# Patient Record
Sex: Female | Born: 1985 | Race: Black or African American | Hispanic: No | Marital: Single | Smoking: Never smoker
Health system: Southern US, Community
[De-identification: ages and names within clinical notes are randomized; demographics above are authoritative.]

## PROBLEM LIST (undated history)

## (undated) DIAGNOSIS — I10 Essential (primary) hypertension: Secondary | ICD-10-CM

## (undated) DIAGNOSIS — E669 Obesity, unspecified: Secondary | ICD-10-CM

## (undated) DIAGNOSIS — L089 Local infection of the skin and subcutaneous tissue, unspecified: Secondary | ICD-10-CM

## (undated) HISTORY — PX: KNEE SURGERY: SHX244

---
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
## 2006-03-16 ENCOUNTER — Emergency Department (HOSPITAL_COMMUNITY): Admission: EM | Admit: 2006-03-16 | Discharge: 2006-03-16 | Payer: Self-pay | Admitting: *Deleted

## 2006-04-04 ENCOUNTER — Ambulatory Visit: Payer: Self-pay | Admitting: *Deleted

## 2006-04-04 ENCOUNTER — Encounter (INDEPENDENT_AMBULATORY_CARE_PROVIDER_SITE_OTHER): Payer: Self-pay | Admitting: Specialist

## 2006-05-27 ENCOUNTER — Emergency Department (HOSPITAL_COMMUNITY): Admission: EM | Admit: 2006-05-27 | Discharge: 2006-05-27 | Payer: Self-pay | Admitting: Emergency Medicine

## 2006-06-03 ENCOUNTER — Emergency Department (HOSPITAL_COMMUNITY): Admission: EM | Admit: 2006-06-03 | Discharge: 2006-06-03 | Payer: Self-pay | Admitting: Emergency Medicine

## 2006-09-21 ENCOUNTER — Emergency Department (HOSPITAL_COMMUNITY): Admission: EM | Admit: 2006-09-21 | Discharge: 2006-09-21 | Payer: Self-pay | Admitting: Emergency Medicine

## 2006-09-26 ENCOUNTER — Emergency Department (HOSPITAL_COMMUNITY): Admission: EM | Admit: 2006-09-26 | Discharge: 2006-09-26 | Payer: Self-pay | Admitting: Emergency Medicine

## 2007-03-08 ENCOUNTER — Emergency Department (HOSPITAL_COMMUNITY): Admission: EM | Admit: 2007-03-08 | Discharge: 2007-03-08 | Payer: Self-pay | Admitting: Emergency Medicine

## 2007-05-15 ENCOUNTER — Inpatient Hospital Stay (HOSPITAL_COMMUNITY): Admission: AD | Admit: 2007-05-15 | Discharge: 2007-05-15 | Payer: Self-pay | Admitting: Family Medicine

## 2007-12-01 ENCOUNTER — Inpatient Hospital Stay (HOSPITAL_COMMUNITY): Admission: AD | Admit: 2007-12-01 | Discharge: 2007-12-02 | Payer: Self-pay | Admitting: Obstetrics & Gynecology

## 2007-12-04 ENCOUNTER — Inpatient Hospital Stay (HOSPITAL_COMMUNITY): Admission: AD | Admit: 2007-12-04 | Discharge: 2007-12-04 | Payer: Self-pay | Admitting: Obstetrics & Gynecology

## 2007-12-11 ENCOUNTER — Inpatient Hospital Stay (HOSPITAL_COMMUNITY): Admission: RE | Admit: 2007-12-11 | Discharge: 2007-12-11 | Payer: Self-pay | Admitting: Obstetrics & Gynecology

## 2008-01-11 ENCOUNTER — Inpatient Hospital Stay (HOSPITAL_COMMUNITY): Admission: AD | Admit: 2008-01-11 | Discharge: 2008-01-11 | Payer: Self-pay | Admitting: Obstetrics and Gynecology

## 2008-04-15 ENCOUNTER — Inpatient Hospital Stay (HOSPITAL_COMMUNITY): Admission: AD | Admit: 2008-04-15 | Discharge: 2008-04-15 | Payer: Self-pay | Admitting: Obstetrics and Gynecology

## 2008-05-15 ENCOUNTER — Inpatient Hospital Stay (HOSPITAL_COMMUNITY): Admission: AD | Admit: 2008-05-15 | Discharge: 2008-05-21 | Payer: Self-pay | Admitting: Obstetrics and Gynecology

## 2008-05-19 ENCOUNTER — Encounter (INDEPENDENT_AMBULATORY_CARE_PROVIDER_SITE_OTHER): Payer: Self-pay | Admitting: Obstetrics & Gynecology

## 2008-05-19 DIAGNOSIS — O42919 Preterm premature rupture of membranes, unspecified as to length of time between rupture and onset of labor, unspecified trimester: Secondary | ICD-10-CM
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
## 2009-01-13 ENCOUNTER — Emergency Department (HOSPITAL_COMMUNITY): Admission: EM | Admit: 2009-01-13 | Discharge: 2009-01-14 | Payer: Self-pay | Admitting: Emergency Medicine

## 2009-01-16 ENCOUNTER — Emergency Department (HOSPITAL_COMMUNITY): Admission: EM | Admit: 2009-01-16 | Discharge: 2009-01-16 | Payer: Self-pay | Admitting: Family Medicine

## 2009-04-18 ENCOUNTER — Emergency Department (HOSPITAL_COMMUNITY): Admission: EM | Admit: 2009-04-18 | Discharge: 2009-04-18 | Payer: Self-pay | Admitting: Emergency Medicine

## 2009-07-16 ENCOUNTER — Emergency Department (HOSPITAL_COMMUNITY): Admission: EM | Admit: 2009-07-16 | Discharge: 2009-07-17 | Payer: Self-pay | Admitting: Emergency Medicine

## 2009-10-18 IMAGING — US US OB TRANSVAGINAL
1 series · 14 of 28 positions shown · non-contrast
Comparison: none

OBSTETRICAL ULTRASOUND:

 This ultrasound exam was performed in the [HOSPITAL] Ultrasound Department.  The OB US report was generated in the AS system, and faxed to the ordering physician.  This report is also available in [REDACTED] PACS.

[Series 1: us ob transvaginal · 0.13mm/px · 14 of 32 slices shown]
[im 2/32]
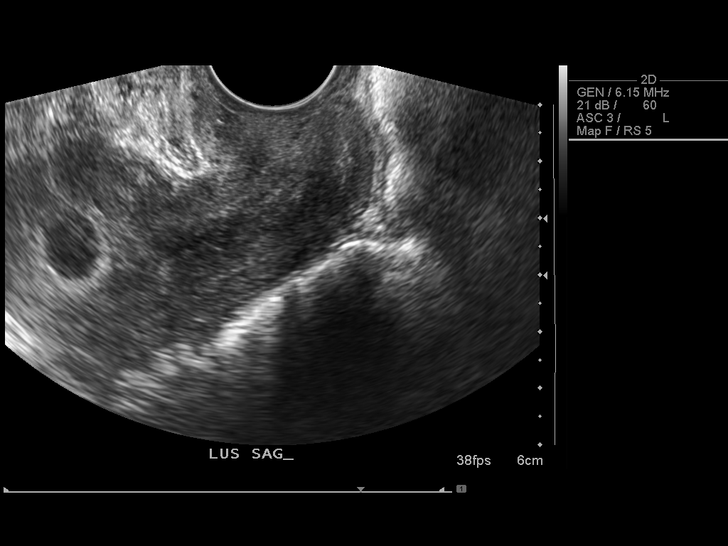
[im 4/32]
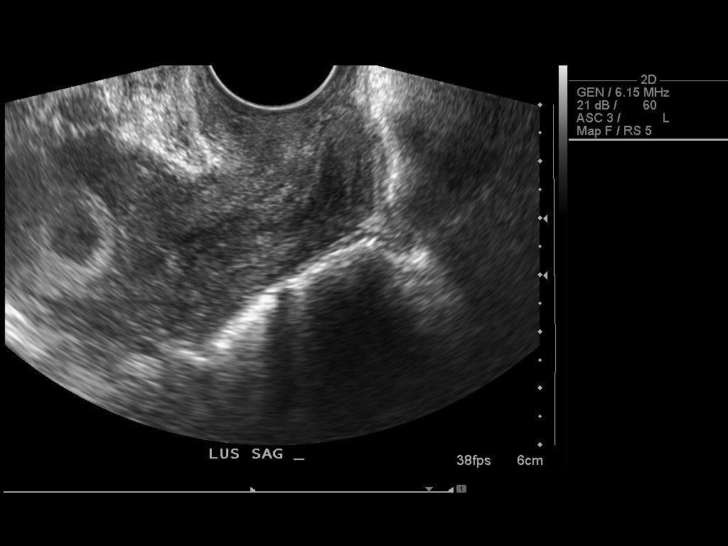
[im 6/32]
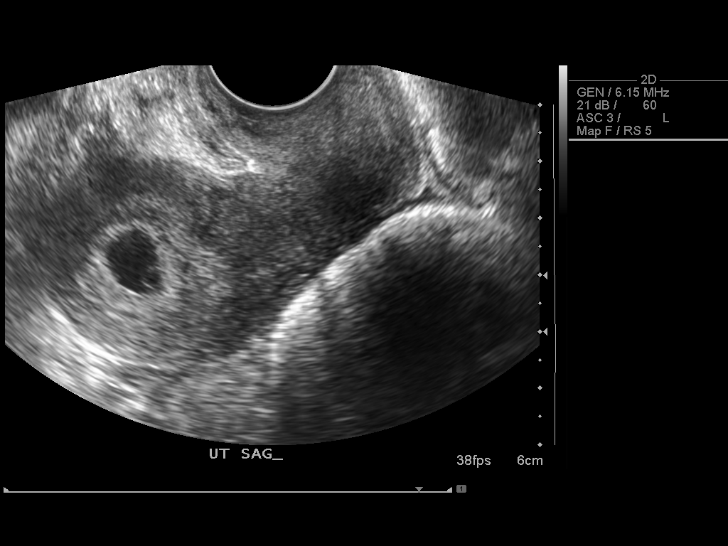
[im 9/32]
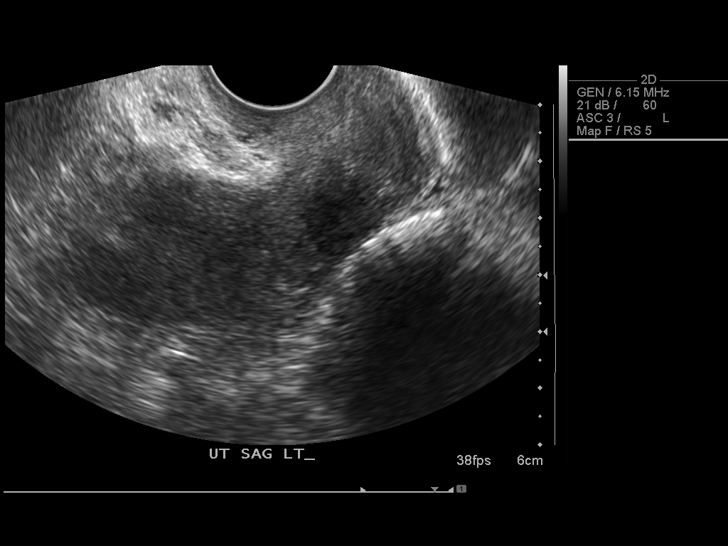
[im 11/32]
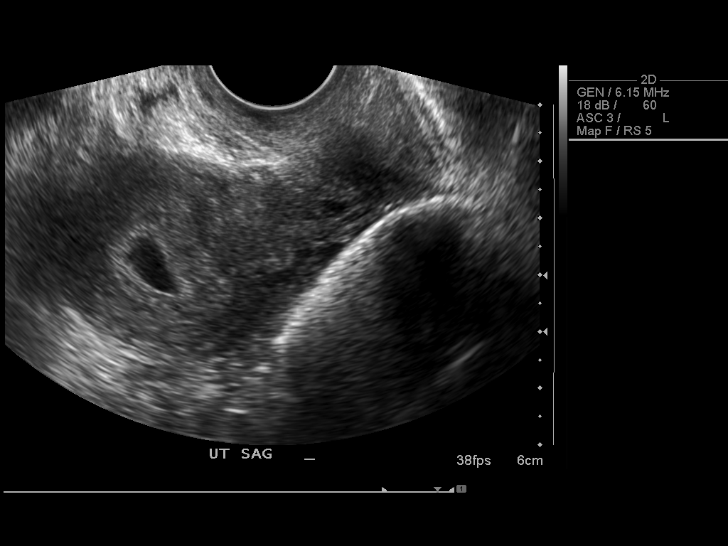
[im 13/32]
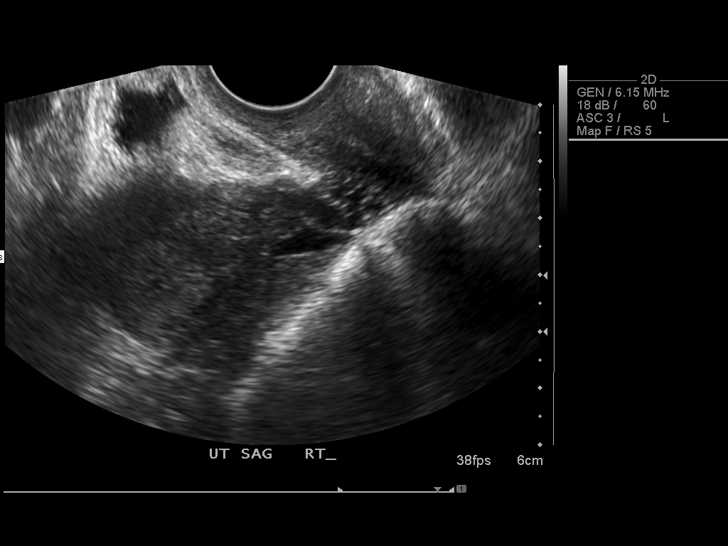
[im 15/32]
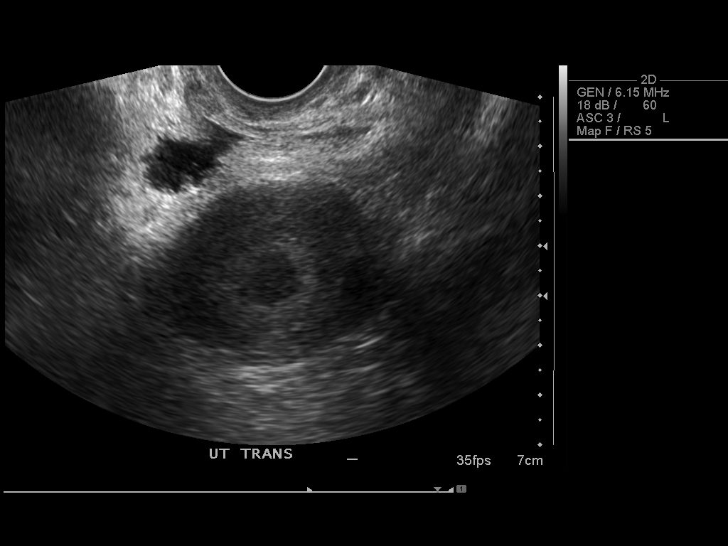
[im 18/32]
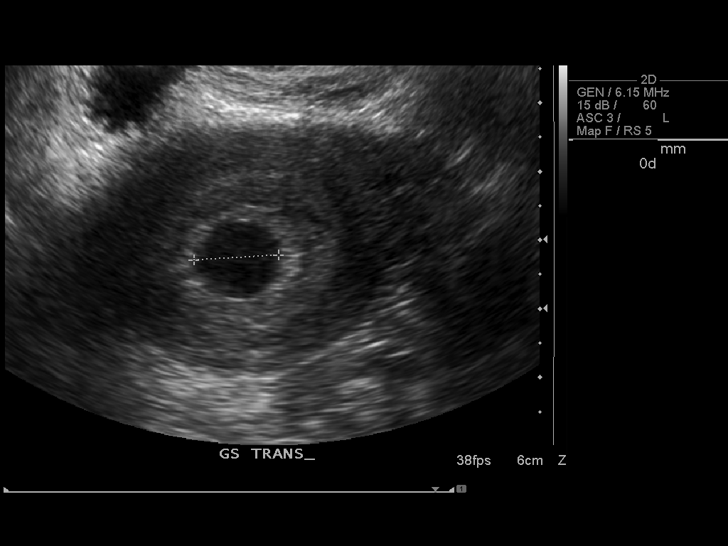
[im 20/32]
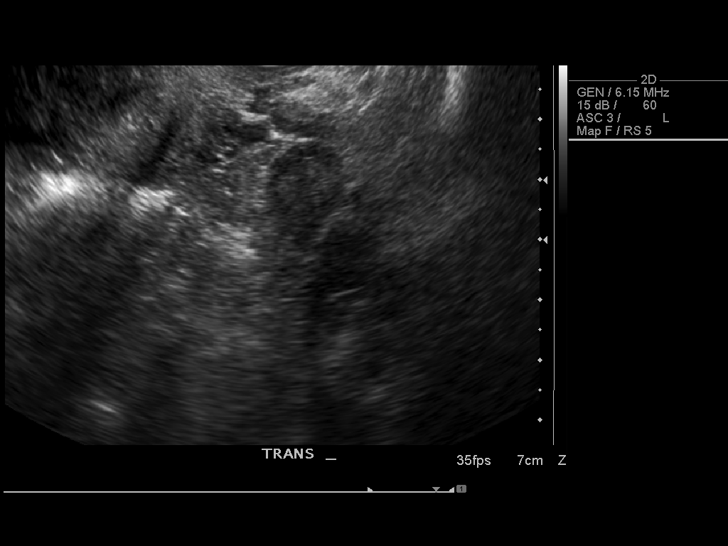
[im 22/32]
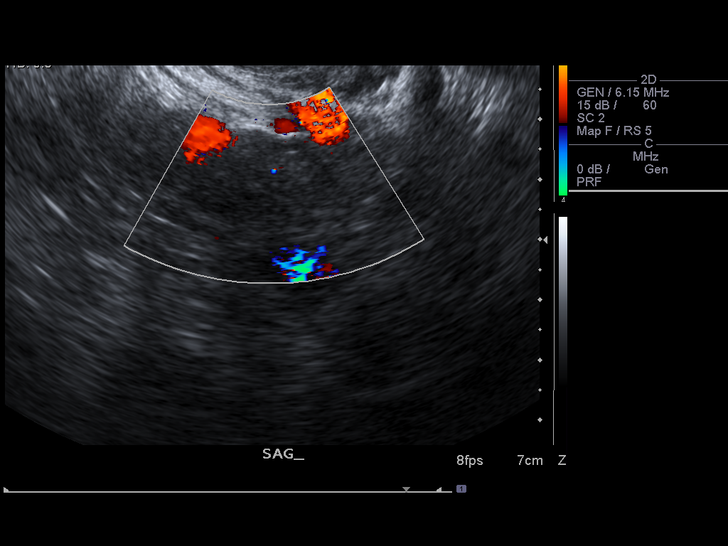
[im 25/32]
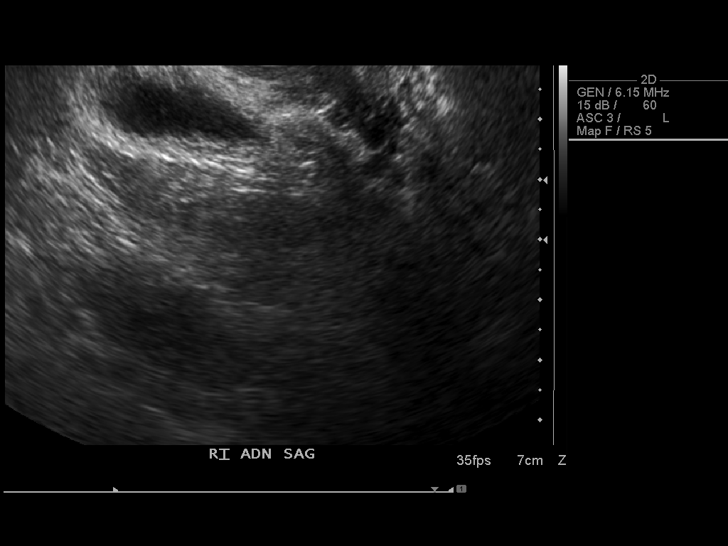
[im 27/32]
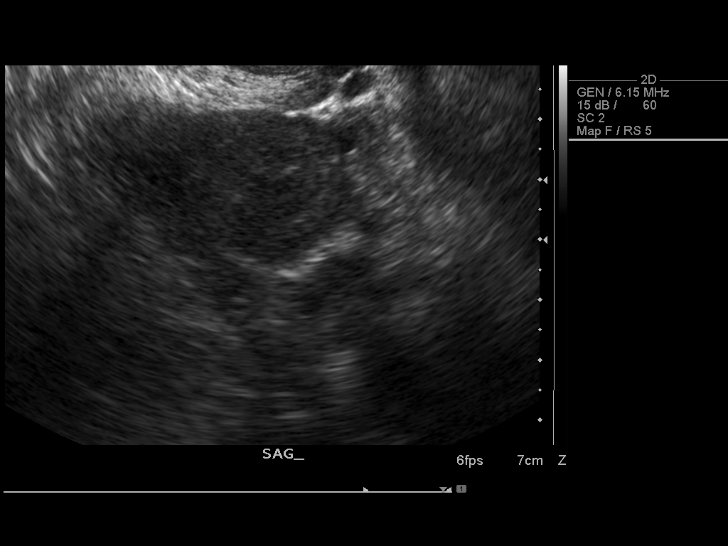
[im 29/32]
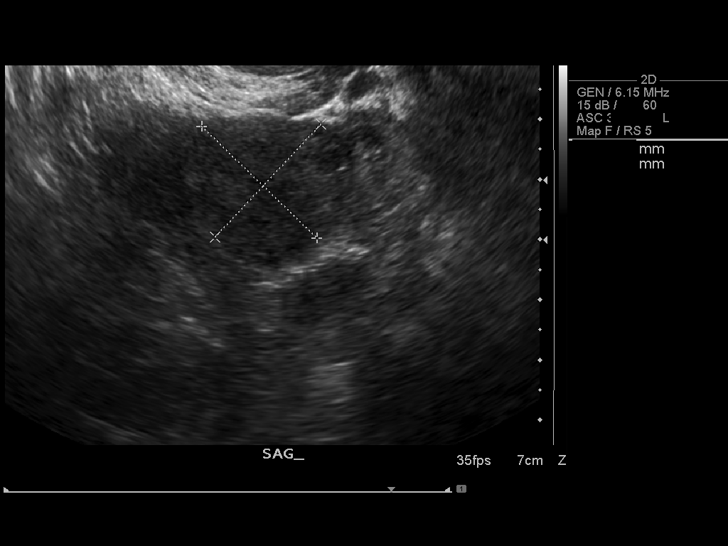
[im 32/32]
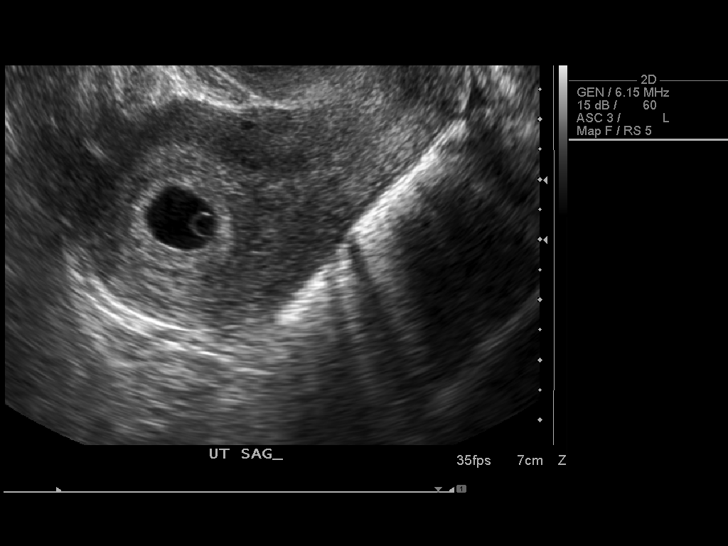

[14 of 28 positions shown; findings below may reference images not displayed]

IMPRESSION: See AS Obstetric US report.

## 2009-11-09 ENCOUNTER — Emergency Department (HOSPITAL_COMMUNITY): Admission: EM | Admit: 2009-11-09 | Discharge: 2009-11-09 | Payer: Self-pay | Admitting: Emergency Medicine

## 2010-11-05 ENCOUNTER — Encounter: Payer: Self-pay | Admitting: Obstetrics & Gynecology

## 2010-11-05 ENCOUNTER — Encounter: Payer: Self-pay | Admitting: Family Medicine

## 2011-01-24 LAB — URINALYSIS, ROUTINE W REFLEX MICROSCOPIC
Bilirubin Urine: NEGATIVE
Glucose, UA: NEGATIVE mg/dL
Hgb urine dipstick: NEGATIVE
Specific Gravity, Urine: 1.036 — ABNORMAL HIGH (ref 1.005–1.030)
pH: 6.5 (ref 5.0–8.0)

## 2011-01-24 LAB — URINE MICROSCOPIC-ADD ON

## 2011-02-24 IMAGING — CR DG WRIST COMPLETE 3+V*L*
1 series · 1 of 1 positions shown · non-contrast
Comparison: None

CLINICAL DATA: Pain in the wrist for a month, no acute injury

LEFT WRIST - COMPLETE 3+ VIEW

[view not recorded]
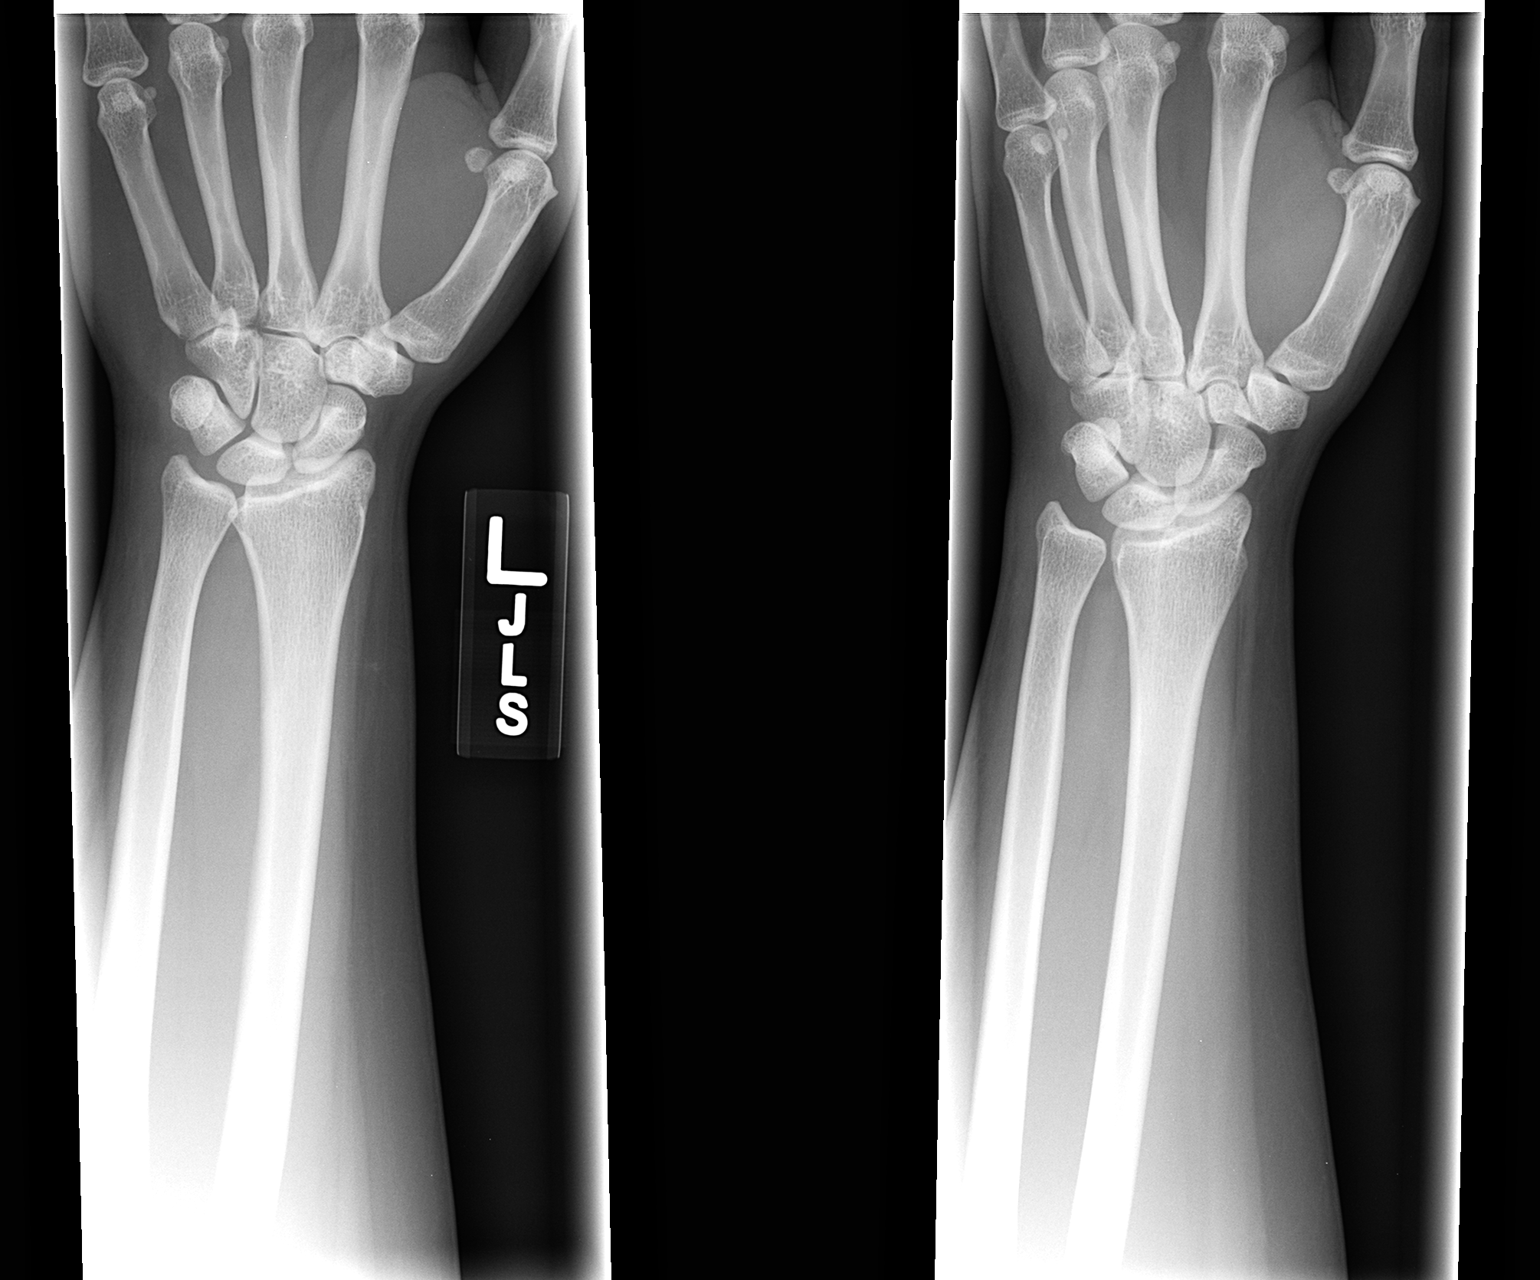

[1 of 1 positions shown; findings below may reference images not displayed]

FINDINGS: The radiocarpal joint space appears normal and the carpal
bones are in normal position.  Intercarpal spaces are normal.  No
acute abnormality is seen and alignment is normal
IMPRESSION: Negative left wrist.
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
## 2011-03-02 NOTE — H&P (Signed)
NAME:  Carly Murray, Carly Murray                       ACCOUNT NO.:  [id number]   MEDICAL RECORD NO.:  [id number]                   PATIENT TYPE:  INP   LOCATION:  0105                                 [facility]ACILITY:  [facility]   PHYSICIAN:  Christene Slates. Katrinka Blazing, M.D.              DATE OF BIRTH:  1985/12/04   DATE OF ADMISSION:  02/16/2004  DATE OF DISCHARGE:                         PSYCHIATRIC ADMISSION ASSESSMENT   IDENTIFICATION:  This 25-year-old female, an 11th grade student at  [employer], was admitted emergently voluntarily in transfer from  the [facility] Emergency Room by ACT assessment of Rick __________,  M.D., for inpatient stabilization of suicide risk and depression.  The  patient had overdosed with Aleve last week and has continued suicide  ideation.   HISTORY OF PRESENT ILLNESS:  The patient is helpless and hopeless in her  current status.  She is worrying excessively and retentively about her  responsibilities not being kept, particularly toward getting college some  day and taking care of grandparents with whom she currently lives.  She  lives with maternal grandmother and grandfather, who are ill.  The patient  indicates that she has had a rough life, but that she has also inherited a  diathesis to depression.  She states that mother and sister are depressed  and both take antidepressants.  The patient reports a 20-pound weight loss  in the last two months.  She reports initial and terminal insomnia.  She has  increased guilt and feelings of worthlessness, hopelessness, and  helplessness.  She indicates that she is generally motivated and energetic.  She has diminished appetite overall.  The patient is finding difficulty  keeping up and feels progressively depressed about this.  She has self-  medicated with cannabis twice weekly for the last month, using three or four  cannabis cigarettes at a time.  She also smokes two cigarettes daily.  She  denies any previous  mental health treatment.  Her worry is difficult to  define.  The patient does not open up and talk about her problems, but  rather has a difficult time enumerating them.  She tends to keep her  problems inside.  She does not acknowledge other substance abuse at this  time.  She denies hallucinations or delusions.  She does have migraine  treated with Midrin.  She also uses Depo-Provera, but denies any definite  correlation of mood difficulty with Depo-Provera chronologically.  She has  no definite history of premenstrual or seasonal exacerbation of mood.  She  appears to have more longstanding tendency to anxiety and worry, but now  weeks to several months progressive depression.   PAST MEDICAL HISTORY:  The patient has a history of migraine treated with  p.r.n. Midrin.  She had her last menses on February 11, 2004.  She is receiving  Depo-Provera.  She had an Aleve overdose last week with no definite  complications medically.  She is  overweight.  She was medically cleared in  the emergency room.  She appears to be likely sexually active by her self-  report.  She does wear glasses.  She has no history of seizure or syncope.  She has no heart murmur or arrhythmia.   ALLERGIES:  She has no medication allergies.   REVIEW OF SYSTEMS:  The patient denies difficulty with gait, gaze, or  continence.  She denies exposure to communicable disease or toxins.  She  denies rash, jaundice, or purpura.  There is no chest pain, palpitations, or  presyncope.  There is no abdominal pain, nausea, vomiting, or diarrhea.  There is no dysuria or arthralgia.   IMMUNIZATIONS:  Up to date.   FAMILY HISTORY:  The patient lives with her maternal grandparents, who are  ill.  Her parents divorced when she was 34 years of old.  Her sister,  Burnett Kanaris, age 19, and her mother both are on antidepressants for depression.  A grandfather and uncle have substance abuse with alcohol.   SOCIAL AND DEVELOPMENTAL HISTORY:   The patient has a part-time job.  She is  an 11th grade student at [employer].  She hopes to go to college  in [place].  However, she is worried she will make any of these goals she  has set for herself.  She has some generalized and obsessive worry and tends  to be retentive.  She is sexually active.  She acknowledges the use of  cannabis over the last month apparently in a self-medicating way.   ASSETS:  The patient is intelligent and motivated in the past.   MENTAL STATUS EXAMINATION:  Height is 62 inches and weight is 184 pounds  with blood pressure 117/60 and heart rate 73 supine and standing blood  pressure 131/69 with heart rate of 74.  The neurological exam is intact.  She has moderate to severe dysphoria consistent with major depression.  She  has some generalized and obsessive worry with retentive features that appear  to be more longstanding, but repressed and suppressed.  She has no mania or  psychosis.  She has no dissociation.  She does not present any homocide or  assaultive ideation, intents, or plan.  She does have suicidal ideation and  the attempt by Aleve overdose last week.   IMPRESSION:   AXIS I:  1. Major depression, single episode, severe.  2. 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  3. Rule out cannabis abuse (provisional diagnosis).  4. Parent/child problem.  5. Other specified family circumstances.   AXIS II:  Deferred.   AXIS III:  1. Migraine.  2. Overweight.  3. Depo-Provera.  4. Aleve overdose recently.   AXIS IV:  Stressors:  Family, severe, acute and chronic; phase of life,  severe, acute.   AXIS V:  Global assessment of functioning 38 with highest in the last year  of 78.   PLAN:  The patient is admitted for inpatient adolescent psychiatric and  multidisciplinary and multimodal behavioral treatment in a team based program on the adolescent psychiatric unit.  Luvox pharmacotherapy  is  recommended starting at 100 mg nightly.  Cognitive, behavioral, and group  psychotherapies and substance abuse interventions are planned.  Family  therapy is also indicated.  The estimated length of stay is five to six days  with targets for discharge being stabilization of suicide risk and mood,  stabilization of anxiety, establishment of capacity for containment and  communicative problem solving, and generalization of capacity for safe  effective participation in outpatient treatment.                                               Christene Slates Katrinka Blazing, M.D.    DGS/MEDQ  D:  02/17/2004  T:  02/18/2004  Job:  [id number]

## 2011-03-02 NOTE — Op Note (Signed)
NAME:  Carly Murray, Carly Murray             ACCOUNT NO.:  [id number]   MEDICAL RECORD NO.:  [id number]          PATIENT TYPE:  OIB   LOCATION:  2550                         FACILITY:  [facility]   PHYSICIAN:  Zola Button T. Lazarus Salines, M.D. 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   PROCEDURES PERFORMED:  Tonsillectomy and adenoidectomy.   SURGEON:  Gloris Manchester. Lazarus Salines, M.D.   ANESTHESIA:  General orotracheal.   BLOOD LOSS:  Minimal.   COMPLICATIONS:  None.   FINDINGS:  Embedded tonsils, 3+. Normal soft palate. Moderate residual  adenoid pad. Relatively clear interior nose.   DESCRIPTION OF PROCEDURE:  With the patient in a comfortable supine  position, general orotracheal anesthesia was induced without difficulty. At  an appropriate level, the table was turned 90 degrees and the patient placed  in Trendelenburg. A clean preparation and draping was accomplished. Taking  care to protect lips, teeth, and endotracheal tube, the Crowe-Davis mouth  gag was introduced, expanded for visualization and suspended from the Mayo  stand in the standard fashion. The findings were as described above. Palate  retractor and mirror were used to visualize the nasopharynx with the  findings as described above. The anterior nose was inspected with the nasal  speculum with the findings as described above. Xylocaine 0.5% with 1:200,000  epinephrine, 8 mL total, was infiltrated into the peritonsillar planes in  both sides for intraoperative hemostasis. Several minutes were allowed this  to take effect.   A red rubber catheter was passed through the nose and out the mouth to serve  as a Producer, television/film/video. Using suction cautery and indirect  visualization, the  adenoids were ablated in the midline. Hemostasis was observed.   After completing the adenoids, the left tonsil was grasped and retracted  medially. The mucosa overlying the anterior and superior poles was  coagulated and then cut down to the capsule of the tonsil. Using the cautery  tip as a blunt dissector, lysing fibrous bands and coagulating crossing  vessels as identified, the tonsil was dissected free of its muscular fossa  from inferiorly upward. The tonsil was removed in its entirety as determined  by examination of both tonsil and fossa. A small additional quantity of  cautery rendered the fossa hemostatic. After completing the left  tonsillectomy, the right side was done in identical fashion.   After completing both tonsillectomies and rendering the oropharynx  hemostatic, the nasopharynx was inspected once again. Hemostasis was  observed.   An orogastric tube was briefly passed into the stomach and a small amount of  clear secretions was evacuated. The tube was removed. At this point, the  palate retractor and mouth gag were relaxed for several minutes. Upon re-  expansion, hemostasis was persistent. At this point, the procedure was  completed. The palate retractor and mouth gag were relaxed and removed. The  dental status was intact. The patient  was returned to anesthesia, awakened,  extubated and transferred to recovery in stable condition.   COMMENT:  A 25 year old black female with recurrent tonsillitis and  obstructive hypertrophy, as well as obesity and a possible history of early  sleep apnea was indication for today's procedure. Will observe for 23 hours  for extended recovery care. Anticipate routine postoperative recovery with  attention to analgesia, antibiosis, hydration and observation for bleeding,  emesis or airway compromise.       KTW/MEDQ  D:  04/26/2005  T:  04/26/2005  Job:  [id number]   cc:   Duncan Dull, M.D.  [address]  [address]  [place]  [place] [address]  Fax: [phone]

## 2011-03-02 NOTE — Discharge Summary (Signed)
Carly Murray, Carly Murray NO.:  [id number]   MEDICAL RECORD NO.:  [id number]          PATIENT TYPE:  INP   LOCATION:  9318                          [facility]ILITY[facility]  [facility]   PHYSICIAN:  Randye Lobo, M.D.   DATE OF BIRTH:  02-17-1986   DATE OF ADMISSION:  05/15/2008  DATE OF DISCHARGE:  05/21/2008                               DISCHARGE SUMMARY   FINAL DIAGNOSES:  1. Intrauterine pregnancy at 28 weeks preterm labor.  2. Spontaneous vaginal delivery of a female infant with Apgars of 8 and      8.   DELIVERY PERFORMED:  Ilda Mori, MD   COMPLICATIONS:  None.   HISTORY:  This 25 year old G1 P0 presents at 27 weeks' gestation with  preterm labor and advanced cervical dilation.  The patient's antepartum  course up to this point had been uncomplicated.  She was being followed  for dysplasia on a Pap smear but her 20-week ultrasound had a cervix of  over 4 cm.  The patient otherwise has had an uncomplicated antepartum  course to this point.  She was placed on magnesium sulfate, admitted for  steroid protocol and penicillin, pending group B strep culture.  Maternal Fetal Medicine was consulted and the patient was started on  Indocin.  The patient was continued to be monitored throughout her first  couple days in the hospital, her contractions decreased.  The patient  had ultrasound performed and was felt to be stable.  By hospital day #5,  the patient started contracting some more and Maternal Fetal Medicine  was consulted.  The patient ended up dilating to complete and on May 19, 2008 had a spontaneous vaginal delivery of a female infant with Apgars  of 8 and 8.  The baby was taken immediately to the NICU and delivery  performed by Dr. Ilda Mori went without complications.  The  patient's postpartum course was benign without significant fevers.  The  baby was stable in the NICU.  The patient did want to receive a Depo-  Provera before discharge which was performed.   She was sent home on a  regular diet, told to decrease activities, told to continue her  vitamins, and was to follow up in our office in 4 weeks.  Instructions  and precautions were reviewed with the patient.   LABS ON DISCHARGE:  The patient had a hemoglobin of 11.4, white blood  cell count of 14.7, and platelets of 291,000.      Leilani Able, P.A.-C.      Randye Lobo, M.D.  Electronically Signed    MB/MEDQ  D:  06/21/2008  T:  06/21/2008  Job:  [id number]

## 2011-03-02 NOTE — Group Therapy Note (Signed)
NAME:  SYDNI, ELIZARRARAZ NO.:  [id number]   MEDICAL RECORD NO.:  [id number]          PATIENT TYPE:  WOC   LOCATION:  [facility]                   FACILITY:  [facility]   PHYSICIAN:  Carolanne Grumbling, M.D.   DATE OF BIRTH:  [birth year]   DATE OF SERVICE:  04/04/2006                                    CLINIC NOTE   GYNECOLOGIC OFFICE VISIT.   CLINIC VISIT:  The patient is an 25 year old G0 who presents for followup of  abnormal Pap smear.  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  She has not had any problems since then.  Pap smear was done.   PLAN:  The patient will be notified via mail when she needs to have her next  Pap smear.           ______________________________  Carolanne Grumbling, M.D.     TW/MEDQ  D:  04/04/2006  T:  04/04/2006  Job:  [id number]

## 2011-03-02 NOTE — Discharge Summary (Signed)
NAME:  Carly Murray, Carly Murray                       ACCOUNT NO.:  [id number]   MEDICAL RECORD NO.:  [id number]                   PATIENT TYPE:  INP   LOCATION:  0107                                 [facility]ILITY[facility]   PHYSICIAN:  Beverly Milch, MD                  DATE OF BIRTH:  Jul 30, 1986   DATE OF ADMISSION:  02/16/2004  DATE OF DISCHARGE:  02/21/2004                                 DISCHARGE SUMMARY   IDENTIFICATION:  This 25 1/25-year-old female, 11th grade student at [employer], was admitted emergently voluntarily in transfer from [facility] Emergency Room for inpatient stabilization of suicide risk and  depression.  The patient had continued suicide ideation and an overdose of  Aleve the week before.  She was helpless and hopeless, overwhelmed with  current daily life as well as having a significant diaphysis to depression  irritably.  For full details, please see the typed admission assessment.   SYNOPSIS OF THE PRESENT ILLNESS:  The patient had initial and terminal  insomnia, 20 pound weight loss in the last two months, guilt, worthlessness,  diminished appetite and was self-medicating with cigarettes and occasional  cannabis.  She described __________ of worry at night preventing sleep.  She  discounts any side effects from Depo Provera.  She has a history of  migraine, treated with p.r.n. Midrin.  Mother and sister are on  antidepressant medications for depression and a grandfather and uncle have  substance abuse with alcohol.  The parents divorced when the patient was age  [age].   INITIAL MENTAL STATUS EXAM:  There was moderate to severe dysphoria.  There  was some generalized and obsessive anxiety.  She tends to be retentive in  storing up of negative emotion.  There is no mania or psychosis.  There is  suicide ideation and previous overdose suicide attempt recently.   LABORATORY FINDINGS:  CBC was normal, except platelet count elevated at  343,000 with  reference range 170,000 to 325,000.  White count was normal at  8,700, hemoglobin 14.4, MCV of 87.  Random glucose in the emergency room at  1316 hours was 101 with fasting glucose repeated at 82.  Sodium was normal  at 139, potassium 4.3, creatinine 0.8, and calcium 9.3 with AST 15, ALT 13  and albumin 3.5.  Glycosylated hemoglobin was normal at 5.5 with reference  range 4.6 to 6.1.  GGT was normal at 10.  TSH was normal at 1.04.  Urine HCG  was negative.  Urine drug screen was negative.   HOSPITAL COURSE AND TREATMENT:  General medical exam by Vic Ripper,  P.A.C. noted left knee arthroscopy by history and one or two cigarettes  daily.  The patient feels that she is struggling with math.  She had  menarche at age 2 and reports sexual activity.  She wears glasses and has a  birthmark on the right flank.  GY[facility] care is at [facility]  Department.  Admission height was 62 inches and weight 184 pounds with blood  pressure 117/60 and a heart rate of 73 supine and standing blood pressure  131/69 with heart rate of 74.  Vital signs were normal throughout hospital  stay and discharge blood pressure was 114/68 with heart rate of 79 supine  and 122/83 with heart rate of 105 standing.  The patient did require Ambien  5 mg at bedtime to sleep well.  She tolerated Luvox well with 100 mg  nightly, and at the time of discharge, dose was increased to 200 mg nightly,  targeting that necessary for efficacy and noting no side effects.  There  were no suicide related side effects.  Discharge weight was 189.5 pounds.  Midrin was required on one occasion during her hospital stay, Feb 20, 2004.  The patient participated in all aspects of active inpatient treatment  including group, milieu, behavioral, individual, anger management, substance  abuse intervention, family, special educational therapy, occupational and  therapeutic recreational therapies.  The patient's anxiety significantly   improved and her dysphoria was showing daily improvement by the time of  discharge.  Suicide ideation remitted.  The patient is recompensated for  aggressive stepwise resumption of her responsibilities and addressed family  issues, particularly in the final family therapy session with mother.  The  patient confronted mother that mother and other siblings must do more to  care for the maternal grandparents that the patient has been attempting to  take care of.  The patient and mother made specific plans to work together  in this regards.  The patient became much more capable of her verbal problem  identification and solving.  She and mother plan to request some math  tutoring from the school for the patient.  She was discharged home in  improved condition, free of suicide ideation.   FINAL DIAGNOSES:   AXIS I:  1. Major depression, single episode, severe.  2. Anxiety disorder, not otherwise specified, with generalized and obsessive     features.  3. Parent-child problem.  4. Other specified family circumstances.   AXIS II:  Diagnosis deferred.   AXIS III:  1. Migraine.  2. Overweight.  3. Depo Provera.  4. Recent Aleve overdose.  5. Reactive thrombocytosis.   AXIS IV:  Stressors:  Family - Severe, acute and chronic; phase of life -  severe, acute.   AXIS V:  Global assessment of functioning on admission was 38 with highest  in the last year of 78 and discharge global assessment of functioning of 55.   PLAN:  The patient was discharged on the following medications:  1. Fluvoxamine 100 mg tablets, to take two tablets every bedtime, quantity     No. 60 with no refill prescribed.  2. Ambien 5 mg tablets, to take one at bedtime if needed for insomnia,     quantity No. 30 with no refill.  3. Midrin, having her own home supply and directions.  4. Depo Provera, as per health department treatment plan.  The patient follows a weight control diet and has no restrictions on  physical  activity.  Crisis and safety plans are established if needed.  She  will see Dr. Mariana Single for psychiatric follow-up on Feb 24, 2004 at 0930.  She will see Abel Presto for individual and family therapy, Feb 24, 2004 at  11:00 a.m.   There is a signed release on the chart for this courtesy  copy.                                               Beverly Milch, MD   GJ/MEDQ  D:  02/23/2004  T:  02/23/2004  Job:  [id number]   cc:   Attn:  Dr. Mariana Single and [facility]  [facility]  Outpatient Psychiatry  [address], [place], [place] [address]

## 2011-07-06 LAB — POCT PREGNANCY, URINE: Preg Test, Ur: POSITIVE

## 2011-07-06 LAB — WET PREP, GENITAL
Trich, Wet Prep: NONE SEEN
Yeast Wet Prep HPF POC: NONE SEEN

## 2011-07-06 LAB — URINALYSIS, ROUTINE W REFLEX MICROSCOPIC
Bilirubin Urine: NEGATIVE
Glucose, UA: NEGATIVE
Hgb urine dipstick: NEGATIVE
Ketones, ur: NEGATIVE
Protein, ur: NEGATIVE

## 2011-07-06 LAB — GC/CHLAMYDIA PROBE AMP, GENITAL: Chlamydia, DNA Probe: NEGATIVE

## 2011-07-09 LAB — URINALYSIS, ROUTINE W REFLEX MICROSCOPIC
Glucose, UA: NEGATIVE
Hgb urine dipstick: NEGATIVE
Ketones, ur: 40 — AB
pH: 5.5

## 2011-07-13 LAB — COMPREHENSIVE METABOLIC PANEL
ALT: 27
Alkaline Phosphatase: 142 — ABNORMAL HIGH
CO2: 22
GFR calc non Af Amer: >60
Glucose, Bld: 87
Potassium: 3.7
Sodium: 136

## 2011-07-13 LAB — URINALYSIS, ROUTINE W REFLEX MICROSCOPIC
Glucose, UA: NEGATIVE
Ketones, ur: NEGATIVE
Protein, ur: NEGATIVE

## 2011-07-13 LAB — CBC
HCT: 33.5 — ABNORMAL LOW
HCT: 39.1
Hemoglobin: 11.4 — ABNORMAL LOW
Hemoglobin: 13.4
RDW: 12.9
RDW: 13
WBC: 14.8 — ABNORMAL HIGH

## 2011-07-13 LAB — STREP B DNA PROBE

## 2011-07-13 LAB — URINE MICROSCOPIC-ADD ON

## 2011-07-30 LAB — URINE MICROSCOPIC-ADD ON

## 2011-07-30 LAB — URINALYSIS, ROUTINE W REFLEX MICROSCOPIC
Glucose, UA: NEGATIVE
Ketones, ur: 40 — AB
Specific Gravity, Urine: >1.03 — ABNORMAL HIGH
pH: 6

## 2011-07-30 LAB — GC/CHLAMYDIA PROBE AMP, GENITAL: GC Probe Amp, Genital: NEGATIVE

## 2015-01-12 DIAGNOSIS — N871 Moderate cervical dysplasia: Secondary | ICD-10-CM | POA: Insufficient documentation

## 2022-07-17 ENCOUNTER — Other Ambulatory Visit: Payer: Self-pay

## 2022-07-17 ENCOUNTER — Emergency Department (HOSPITAL_BASED_OUTPATIENT_CLINIC_OR_DEPARTMENT_OTHER)
Admission: EM | Admit: 2022-07-17 | Discharge: 2022-07-17 | Discharge status: Against Medical Advice | Payer: Commercial Managed Care - HMO | Attending: Emergency Medicine | Admitting: Emergency Medicine

## 2022-07-17 ENCOUNTER — Encounter (HOSPITAL_BASED_OUTPATIENT_CLINIC_OR_DEPARTMENT_OTHER): Payer: Self-pay

## 2022-07-17 DIAGNOSIS — N611 Abscess of the breast and nipple: Secondary | ICD-10-CM | POA: Insufficient documentation

## 2022-07-17 DIAGNOSIS — Z5321 Procedure and treatment not carried out due to patient leaving prior to being seen by health care provider: Secondary | ICD-10-CM | POA: Insufficient documentation

## 2022-07-17 NOTE — ED Triage Notes (Signed)
POV, pt sts that she noticed two abscesses on left breast. One is draining pus/blood, the other one hasn't started draining yet. NAD. A&O x4, amb.  Pt has abscess on right breast and just completed abx for that.

## 2022-08-26 ENCOUNTER — Emergency Department (HOSPITAL_BASED_OUTPATIENT_CLINIC_OR_DEPARTMENT_OTHER)
Admission: EM | Admit: 2022-08-26 | Discharge: 2022-08-27 | Disposition: A | Discharge status: Home / Self Care | Payer: Commercial Managed Care - HMO | Attending: Emergency Medicine | Admitting: Emergency Medicine

## 2022-08-26 ENCOUNTER — Encounter (HOSPITAL_BASED_OUTPATIENT_CLINIC_OR_DEPARTMENT_OTHER): Payer: Self-pay | Admitting: *Deleted

## 2022-08-26 ENCOUNTER — Other Ambulatory Visit: Payer: Self-pay

## 2022-08-26 DIAGNOSIS — L089 Local infection of the skin and subcutaneous tissue, unspecified: Secondary | ICD-10-CM | POA: Diagnosis not present

## 2022-08-26 DIAGNOSIS — L02211 Cutaneous abscess of abdominal wall: Secondary | ICD-10-CM | POA: Insufficient documentation

## 2022-08-26 DIAGNOSIS — L0291 Cutaneous abscess, unspecified: Secondary | ICD-10-CM

## 2022-08-26 HISTORY — DX: Obesity, unspecified: E66.9

## 2022-08-26 HISTORY — DX: Local infection of the skin and subcutaneous tissue, unspecified: L08.9

## 2022-08-26 MED ORDER — LIDOCAINE HCL (PF) 1 % IJ SOLN
10.0000 mL | Freq: Once | INTRAMUSCULAR | Status: AC
  Administered 2022-08-26: 10 mL via INTRADERMAL
  Filled 2022-08-26: qty 10

## 2022-08-26 MED ORDER — CEPHALEXIN 250 MG PO CAPS: 250.0000 mg | ORAL_CAPSULE | Freq: Four times a day (QID) | ORAL | 0 refills | Status: DC

## 2022-08-26 MED ORDER — SULFAMETHOXAZOLE-TRIMETHOPRIM 800-160 MG PO TABS: 1.0000 | ORAL_TABLET | Freq: Two times a day (BID) | ORAL | 0 refills | Status: AC

## 2022-08-26 NOTE — ED Notes (Signed)
D/c paperwork reviewed with pt, incl prescriptions.  No questions or concerns at time of d/c. Ambulatory to ED exit without assistance, NAD>

## 2022-08-26 NOTE — ED Provider Notes (Signed)
[facility] Provider Note   CSN: [id number] Arrival date & time: 08/26/22  1859     History {Add pertinent medical, surgical, social history, OB history to HPI:1} Chief Complaint  Patient presents with   Recurrent Skin Infections    Carly Murray is a 36 y.o. female with no significant past medical history presenting to the emergency department for evaluation of an abscess.  Patient states she noted an abscess on her right lower abdomen 2 days ago.  Since then the abscess has increased in size and has become increasingly painful.  She has tried Tylenol and ibuprofen with minimal improvement.  Reports another abscess close to the umbilicus which has popped recently.  No fever.  Denies chest pain, shortness of breath, nausea, vomiting, constipation, diarrhea.  HPI     Home Medications Prior to Admission medications   Not on File      Allergies    Patient has no known allergies.    Review of Systems   Review of Systems  Physical Exam Updated Vital Signs BP (!) 158/93 (BP Location: Right Arm)   Pulse 79   Temp 98.3 F (36.8 C) (Oral)   Resp 16   Ht 5\' 2"  (1.575 m)   Wt 134 kg   LMP 08/09/2022   SpO2 99%   BMI 54.03 kg/m  Physical Exam Vitals and nursing note reviewed.  Constitutional:      Appearance: Normal appearance.  HENT:     Head: Normocephalic and atraumatic.     Mouth/Throat:     Mouth: Mucous membranes are moist.  Eyes:     General: No scleral icterus. Cardiovascular:     Rate and Rhythm: Normal rate and regular rhythm.     Pulses: Normal pulses.     Heart sounds: Normal heart sounds.  Pulmonary:     Effort: Pulmonary effort is normal.     Breath sounds: Normal breath sounds.  Abdominal:     General: Abdomen is flat.     Palpations: Abdomen is soft.     Tenderness: There is no abdominal tenderness.  Musculoskeletal:        General: No deformity.  Skin:    General: Skin is warm.     Findings: No rash.      Comments: 2 x 2 centimeter abscess on right lower quadrant.  No fluid or blood drainage.  Neurological:     General: No focal deficit present.     Mental Status: She is alert.  Psychiatric:        Mood and Affect: Mood normal.     ED Results / Procedures / Treatments   Labs (all labs ordered are listed, but only abnormal results are displayed) Labs Reviewed - No data to display  EKG None  Radiology No results found.  Procedures Procedures  {Document cardiac monitor, telemetry assessment procedure when appropriate:1}  Medications Ordered in ED Medications - No data to display  ED Course/ Medical Decision Making/ A&P                           Medical Decision Making  This patient presents to the ED for ***, this involves an extensive number of treatment options, and is a complaint that carries with a high risk of complications and morbidity.  The differential diagnosis includes ***.  This is not an exhaustive list.  Comorbidities that complicate the patient evaluation See HPI  Social determinants of health  NA  Additional history obtained: Additional history obtained from EMR. External records from outside source obtained and review including prior labs  Cardiac monitoring/EKG: The patient was maintained on a cardiac monitor.  I personally reviewed and interpreted the cardiac monitor which showed an underlying rhythm of: Sinus rhythm.  Lab tests: I ordered and personally interpreted labs.  The pertinent results include WBC normal. Hbg normal. Platelets normal. No electrolyte abnormalities noted. BUN, creatinine normal. No transaminitis. UA significant for no acute abnormality. ***  Imaging studies: I ordered imaging studies including ***. I personally reviewed, interpreted imaging and agree with the radiologist's interpretations.  Problem list/ ED course/ Critical interventions/ Medical management: HPI: See above Vital signs ***otherwise within normal range  and stable throughout visit. Laboratory/imaging studies significant for: See above. On physical examination, patient is afebrile and appears in no acute distress. ***.  Patient's clinical presentations and laboratory/imaging studies are most concerned for ***. I sent a Rx of ***. Advised patient to ***. *** ordered. Reevaluation of the patient after these medications showed that the patient {resolved/improved/worsened:23923::"improved"}.   I have reviewed the patient home medicines and have made adjustments as needed.  Consultations obtained: I requested consultation with ***, and discussed lab and imaging findings as well as pertinent plan.  They recommend ***  Disposition Continued outpatient therapy. Follow-up with PCP *** recommended for reevaluation of symptoms. Treatment plan discussed with patient.  Pt acknowledged understanding was agreeable to the plan. Worrisome signs and symptoms were discussed with patient, and patient acknowledged understanding to return to the ED if they noticed these signs and symptoms. Patient was stable upon discharge.   This chart was dictated using voice recognition software.  Despite best efforts to proofread,  errors can occur which can change the documentation meaning.    {Document critical care time when appropriate:1} {Document review of labs and clinical decision tools ie heart score, Chads2Vasc2 etc:1}  {Document your independent review of radiology images, and any outside records:1} {Document your discussion with family members, caretakers, and with consultants:1} {Document social determinants of health affecting pt's care:1} {Document your decision making why or why not admission, treatments were needed:1} Final Clinical Impression(s) / ED Diagnoses Final diagnoses:  None    Rx / DC Orders ED Discharge Orders     None

## 2022-08-26 NOTE — Discharge Instructions (Addendum)
Please take your antibiotics as prescribed.  Keep the wound dry.  Change dressing every 24 hours.  Return to a nearby urgent care or emergency department for packing changes after 48 hours. Take tylenol/ibuprofen for pain. I recommend close follow-up with PCP for reevaluation.  Please do not hesitate to return to emergency department if worrisome signs symptoms we discussed become apparent.

## 2022-08-26 NOTE — ED Triage Notes (Signed)
Pt here with 2 boils on her lower abdominal fold, one has popped.  No fever or chills with this.  Pt has hx of same

## 2022-08-28 ENCOUNTER — Emergency Department (HOSPITAL_BASED_OUTPATIENT_CLINIC_OR_DEPARTMENT_OTHER)
Admission: EM | Admit: 2022-08-28 | Discharge: 2022-08-28 | Discharge status: Against Medical Advice | Payer: Commercial Managed Care - HMO | Attending: Emergency Medicine | Admitting: Emergency Medicine

## 2022-08-28 ENCOUNTER — Encounter (HOSPITAL_BASED_OUTPATIENT_CLINIC_OR_DEPARTMENT_OTHER): Payer: Self-pay | Admitting: Emergency Medicine

## 2022-08-28 ENCOUNTER — Other Ambulatory Visit: Payer: Self-pay

## 2022-08-28 DIAGNOSIS — Z5321 Procedure and treatment not carried out due to patient leaving prior to being seen by health care provider: Secondary | ICD-10-CM | POA: Insufficient documentation

## 2022-08-28 DIAGNOSIS — L0291 Cutaneous abscess, unspecified: Secondary | ICD-10-CM | POA: Diagnosis present

## 2022-08-28 NOTE — ED Notes (Signed)
Pt not visualized in room or department.

## 2022-08-28 NOTE — ED Triage Notes (Signed)
Pt seen on 11/12 for abscess. Abscess was drained and packed. Pt returns today to have packing removed. Denies issues with wound.

## 2023-01-12 ENCOUNTER — Emergency Department (HOSPITAL_BASED_OUTPATIENT_CLINIC_OR_DEPARTMENT_OTHER)
Admission: EM | Admit: 2023-01-12 | Discharge: 2023-01-12 | Disposition: A | Discharge status: Home / Self Care | Payer: Medicaid Other | Attending: Emergency Medicine | Admitting: Emergency Medicine

## 2023-01-12 ENCOUNTER — Encounter (HOSPITAL_BASED_OUTPATIENT_CLINIC_OR_DEPARTMENT_OTHER): Payer: Self-pay | Admitting: Emergency Medicine

## 2023-01-12 DIAGNOSIS — M79604 Pain in right leg: Secondary | ICD-10-CM

## 2023-01-12 DIAGNOSIS — F17200 Nicotine dependence, unspecified, uncomplicated: Secondary | ICD-10-CM | POA: Insufficient documentation

## 2023-01-12 DIAGNOSIS — M79661 Pain in right lower leg: Secondary | ICD-10-CM | POA: Diagnosis not present

## 2023-01-12 NOTE — Discharge Instructions (Signed)
You came to the emergency department with pain in your right leg.  I do believe it is because you have been sedentary since you have not been feeling well.  Please use Tylenol and ibuprofen for your discomfort.  It was a pleasure to meet you and we hope you feel better!

## 2023-01-12 NOTE — ED Triage Notes (Signed)
Right leg pain x a few days Achy. Getting worse, uncomfortable to walk.\tylenol minimal relief

## 2023-01-12 NOTE — ED Provider Notes (Signed)
[facility] Provider Note   CSN: [id number] Arrival date & time: 01/12/23  1802     History  Chief Complaint  Patient presents with   Leg Pain    Carly Murray is a 37 y.o. female presenting with right leg pain.  Has been going on since this morning.  Her mother said that she was limping and wanted to get it checked out.  No history of DVT, no recent travel or surgery, no OCPs but does report occasional tobacco use.  No numbness or tingling, just says it is "throbbing"   Leg Pain      Home Medications Prior to Admission medications   Medication Sig Start Date End Date Taking? Authorizing Provider  cephALEXin (KEFLEX) 250 MG capsule Take 1 capsule (250 mg total) by mouth 4 (four) times daily. 08/26/22   Rex Kras, PA      Allergies    Patient has no active allergies.    Review of Systems   Review of Systems  Physical Exam Updated Vital Signs BP (!) 198/95 (BP Location: Right Arm)   Pulse 84   Temp 98.2 F (36.8 C)   Resp 18   LMP 01/10/2023   SpO2 100%  Physical Exam Vitals and nursing note reviewed.  Constitutional:      Appearance: Normal appearance.  HENT:     Head: Normocephalic and atraumatic.  Eyes:     General: No scleral icterus.    Conjunctiva/sclera: Conjunctivae normal.  Pulmonary:     Effort: Pulmonary effort is normal. No respiratory distress.  Musculoskeletal:     Comments: Patient is ambulatory in the room.  No apparent limp.  Full range of motion at the hip, knee and ankle.  Strong DP pulse.  No appreciable swelling.  No bruising or deformities  Skin:    Findings: No rash.  Neurological:     Mental Status: She is alert.  Psychiatric:        Mood and Affect: Mood normal.     ED Results / Procedures / Treatments   Labs (all labs ordered are listed, but only abnormal results are displayed) Labs Reviewed - No data to display  EKG None  Radiology No results  found.  Procedures Procedures    Medications Ordered in ED Medications - No data to display  ED Course/ Medical Decision Making/ A&P                             Medical Decision Making  37 year old female presenting with leg pain.  Differential includes but is not limited to DVT, cellulitis, peripheral edema, fracture or dislocation.  Imaging: Considered ultrasound however only risk factor is tobacco use.  Clinically she does not appear to have a DVT so this was deferred  MDM/disposition: 37 year old presenting with pain to the right leg.  I suspect is secondary to body habitus and recently sedentary lifestyle due to having URI.  She is agreeable to this explanation.  Neurovascularly intact, does not have an emergent condition requiring further evaluation and discharged   Final Clinical Impression(s) / ED Diagnoses Final diagnoses:  Pain of right lower extremity    Rx / DC Orders ED Discharge Orders     None      Results and diagnoses were explained to the patient. Return precautions discussed in full. Patient had no additional questions and expressed complete understanding.   This chart was dictated using voice  recognition software.  Despite best efforts to proofread,  errors can occur which can change the documentation meaning.    Darliss Ridgel 01/12/23 1949    Tretha Sciara, MD 01/12/23 [phone]

## 2023-05-05 ENCOUNTER — Emergency Department (HOSPITAL_BASED_OUTPATIENT_CLINIC_OR_DEPARTMENT_OTHER)
Admission: EM | Admit: 2023-05-05 | Discharge: 2023-05-05 | Disposition: A | Discharge status: Home / Self Care | Payer: Medicaid Other | Attending: Emergency Medicine | Admitting: Emergency Medicine

## 2023-05-05 ENCOUNTER — Emergency Department (HOSPITAL_BASED_OUTPATIENT_CLINIC_OR_DEPARTMENT_OTHER): Payer: Medicaid Other

## 2023-05-05 ENCOUNTER — Encounter (HOSPITAL_BASED_OUTPATIENT_CLINIC_OR_DEPARTMENT_OTHER): Payer: Self-pay | Admitting: Urology

## 2023-05-05 DIAGNOSIS — M79604 Pain in right leg: Secondary | ICD-10-CM | POA: Insufficient documentation

## 2023-05-05 MED ORDER — CYCLOBENZAPRINE HCL 10 MG PO TABS: 10.0000 mg | ORAL_TABLET | Freq: Two times a day (BID) | ORAL | 0 refills | Status: DC | PRN

## 2023-05-05 NOTE — ED Provider Notes (Signed)
[facility] AT [facility] Provider Note   CSN: [id number] Arrival date & time: 05/05/23  1004     History  Chief Complaint  Patient presents with   Leg Pain    Carly Murray is a 37 y.o. female.  With no significant past medical history who presents to the ED for evaluation of right leg pain.  States pain has been intermittent x 3 months.  It is described as a muscular type pain localized in the upper leg and calf. It is an aching pain.  She states she typically notices this when she is driving.  She cannot drive for more than 15 minutes at a time.  She states it makes it difficult to drive as well.  She denies any specific injury.  No knee pain or hip pain.  No joint swelling.  No fevers or chills.  No unilateral leg swelling.  No history of DVT or PE.  No recent cancer treatments, surgeries, long distance travel.  She does not take any birth control.   Leg Pain      Home Medications Prior to Admission medications   Medication Sig Start Date End Date Taking? Authorizing Provider  cyclobenzaprine (FLEXERIL) 10 MG tablet Take 1 tablet (10 mg total) by mouth 2 (two) times daily as needed for muscle spasms. 05/05/23  Yes Hayla Hinger, Edsel Petrin, PA-C  cephALEXin (KEFLEX) 250 MG capsule Take 1 capsule (250 mg total) by mouth 4 (four) times daily. 08/26/22   Jeanelle Malling, PA      Allergies    Patient has no known allergies.    Review of Systems   Review of Systems  Musculoskeletal:  Positive for myalgias.  All other systems reviewed and are negative.   Physical Exam Updated Vital Signs BP (!) 163/115 (BP Location: Left Arm)   Pulse 92   Temp 98.1 F (36.7 C) (Oral)   Resp 18   Ht 5\' 2"  (1.575 m)   Wt 134 kg   LMP 04/12/2023   SpO2 100%   BMI 54.03 kg/m  Physical Exam Vitals and nursing note reviewed.  Constitutional:      General: She is not in acute distress.    Appearance: Normal appearance. She is normal weight. She is not ill-appearing.      Comments: Resting comfortably in bed  HENT:     Head: Normocephalic and atraumatic.  Pulmonary:     Effort: Pulmonary effort is normal. No respiratory distress.  Abdominal:     General: Abdomen is flat.  Musculoskeletal:        General: Normal range of motion.     Cervical back: Neck supple.     Comments: No right lower extremity swelling when compared contralaterally.  Sensation intact in all digits.  DP pulses 2+.  Full AROM of the right hip, knee and ankle.  Negative Homans' sign.  Ambulatory in the ED.  Skin:    General: Skin is warm and dry.  Neurological:     Mental Status: She is alert and oriented to person, place, and time.  Psychiatric:        Mood and Affect: Mood normal.        Behavior: Behavior normal.     ED Results / Procedures / Treatments   Labs (all labs ordered are listed, but only abnormal results are displayed) Labs Reviewed - No data to display  EKG None  Radiology US Venous Img Lower Right (DVT Study)  Result Date: 05/05/2023 CLINICAL  DATA:  Right thigh pain and mid calf pain for 3 months. EXAM: RIGHT LOWER EXTREMITY VENOUS DOPPLER ULTRASOUND TECHNIQUE: Gray-scale sonography with compression, as well as color and duplex ultrasound, were performed to evaluate the deep venous system(s) from the level of the common femoral vein through the popliteal and proximal calf veins. COMPARISON:  None Available. FINDINGS: VENOUS Normal compressibility of the common femoral, superficial femoral, and popliteal veins, as well as the visualized calf veins. Visualized portions of profunda femoral vein and great saphenous vein unremarkable. No filling defects to suggest DVT on grayscale or color Doppler imaging. Doppler waveforms show normal direction of venous flow, normal respiratory plasticity and response to augmentation. Limited views of the contralateral common femoral vein are unremarkable. OTHER None. Limitations: none IMPRESSION: Negative. Electronically Signed    By: Signa Kell M.D.   On: 05/05/2023 12:25    Procedures Procedures    Medications Ordered in ED Medications - No data to display  ED Course/ Medical Decision Making/ A&P                             Medical Decision Making  This patient presents to the ED for concern of right lower extremity pain, this involves an extensive number of treatment options, and is a complaint that carries with it a high risk of complications and morbidity.  The differential diagnosis includes fracture, strain, sprain, contusion, dislocation, DVT  My initial workup includes DVT study.  Patient declines pain medication  Additional history obtained from: Nursing notes from this visit.  I ordered imaging studies including DVT study right lower extremity I independently visualized and interpreted imaging which showed negative I agree with the radiologist interpretation  Afebrile, hypertensive but otherwise hemodynamically stable.  37 year old female presents ED for evaluation of right lower extremity pain.  This is been intermittent for the past 3 months.  It is worse when she is driving.  Is not specifically posterior leg.  It does not necessarily involve any of the joints.  It is described as more of a muscular type pain.  She states she has been trying to lose weight lately and believes this may be contributing to her symptoms.  She presents today to rule out any emergencies.  Her DVT study is negative.  Neurovascular status is intact.  She is in no distress.  She declines pain medication in the ED.  She follows with [facility] by her primary care. Overall I do suspect an overuse injury as the cause of her symptoms.  She was given a prescription for Flexeril and educated on potential side effects.  She was encouraged to follow up with her primary care provider in the next week for reevaluation of her symptoms.  She was given return precautions.  Stable at discharge.  At this time there does not  appear to be any evidence of an acute emergency medical condition and the patient appears stable for discharge with appropriate outpatient follow up. Diagnosis was discussed with patient who verbalizes understanding of care plan and is agreeable to discharge. I have discussed return precautions with patient who verbalizes understanding. Patient encouraged to follow-up with their PCP within 1 week. All questions answered.  Note: Portions of this report may have been transcribed using voice recognition software. Every effort was made to ensure accuracy; however, inadvertent computerized transcription errors may still be present.        Final Clinical Impression(s) / ED Diagnoses Final  diagnoses:  Right leg pain    Rx / DC Orders ED Discharge Orders          Ordered    cyclobenzaprine (FLEXERIL) 10 MG tablet  2 times daily PRN        05/05/23 1243              Michelle Piper, PA-C 05/05/23 1244    [place], Adam, DO 05/05/23 1310

## 2023-05-05 NOTE — ED Triage Notes (Signed)
Ambulatory to triage with normal gait Pt states right leg pain x 3 months State pain worse with driving States aching feeling from the hip down and states intermittently affects walking

## 2023-05-05 NOTE — Discharge Instructions (Signed)
You have been seen today for your complaint of right leg pain. Your imaging was reassuring and showed no abnormalities. Your discharge medications include flexeril. This is a muscle relaxer. It may cause drowsiness. Do not drive, operate heavy machinery or make important decisions when taking this medication. Only take it at night until you know how it affects you. Only take it as needed and take other medications such as ibuprofen or tylenol prior to trying this medication. Follow up with: Your PCP within the next week for reevaluation Please seek immediate medical care if you develop any of the following symptoms: You have trouble breathing. You have trouble swallowing. You have muscle pain along with a stiff neck, fever, and vomiting. You have severe muscle weakness, or you cannot move part of your body. You are urinating less, or you have dark, bloody, or discolored urine. You have redness or swelling at the site of the muscle pain. At this time there does not appear to be the presence of an emergent medical condition, however there is always the potential for conditions to change. Please read and follow the below instructions.  Do not take your medicine if  develop an itchy rash, swelling in your mouth or lips, or difficulty breathing; call 911 and seek immediate emergency medical attention if this occurs.  You may review your lab tests and imaging results in their entirety on your MyChart account.  Please discuss all results of fully with your primary care provider and other specialist at your follow-up visit.  Note: Portions of this text may have been transcribed using voice recognition software. Every effort was made to ensure accuracy; however, inadvertent computerized transcription errors may still be present.

## 2023-05-16 ENCOUNTER — Encounter (INDEPENDENT_AMBULATORY_CARE_PROVIDER_SITE_OTHER): Payer: Medicaid Other | Admitting: Family Medicine

## 2023-06-21 ENCOUNTER — Other Ambulatory Visit: Payer: Self-pay

## 2023-06-21 ENCOUNTER — Other Ambulatory Visit (HOSPITAL_COMMUNITY): Payer: Self-pay

## 2023-06-21 MED ORDER — SEMAGLUTIDE-WEIGHT MANAGEMENT 0.25 MG/0.5ML ~~LOC~~ SOAJ
0.2500 mg | SUBCUTANEOUS | 0 refills | Status: DC
  Filled 2023-06-21: qty 2, 28d supply, fill #0

## 2023-07-10 ENCOUNTER — Other Ambulatory Visit (HOSPITAL_COMMUNITY): Payer: Self-pay

## 2023-07-10 MED ORDER — WEGOVY 0.5 MG/0.5ML ~~LOC~~ SOAJ
0.5000 mg | SUBCUTANEOUS | 0 refills | Status: DC
  Filled 2023-07-10 – 2023-07-12 (×2): qty 2, 28d supply, fill #0

## 2023-07-12 ENCOUNTER — Encounter (HOSPITAL_COMMUNITY): Payer: Self-pay

## 2023-07-12 ENCOUNTER — Other Ambulatory Visit (HOSPITAL_COMMUNITY): Payer: Self-pay

## 2023-07-16 ENCOUNTER — Other Ambulatory Visit (HOSPITAL_COMMUNITY): Payer: Self-pay

## 2023-07-25 ENCOUNTER — Other Ambulatory Visit (HOSPITAL_COMMUNITY): Payer: Self-pay

## 2023-07-25 MED ORDER — WEGOVY 1 MG/0.5ML ~~LOC~~ SOAJ
1.0000 mg | SUBCUTANEOUS | 0 refills | Status: DC
  Filled 2023-07-25 – 2023-07-31 (×2): qty 2, 28d supply, fill #0

## 2023-07-31 ENCOUNTER — Other Ambulatory Visit (HOSPITAL_COMMUNITY): Payer: Self-pay

## 2023-09-05 ENCOUNTER — Other Ambulatory Visit (HOSPITAL_COMMUNITY): Payer: Self-pay

## 2023-09-05 MED ORDER — WEGOVY 1.7 MG/0.75ML ~~LOC~~ SOAJ
1.7000 mg | SUBCUTANEOUS | 0 refills | Status: DC
  Filled 2023-09-05: qty 3, 28d supply, fill #0

## 2023-10-01 ENCOUNTER — Other Ambulatory Visit (HOSPITAL_COMMUNITY): Payer: Self-pay

## 2023-10-01 MED ORDER — WEGOVY 1.7 MG/0.75ML ~~LOC~~ SOAJ
0.7500 mL | SUBCUTANEOUS | 2 refills | Status: DC
  Filled 2023-10-01: qty 3, 28d supply, fill #0

## 2023-10-02 ENCOUNTER — Emergency Department (HOSPITAL_BASED_OUTPATIENT_CLINIC_OR_DEPARTMENT_OTHER)
Admission: EM | Admit: 2023-10-02 | Discharge: 2023-10-03 | Disposition: A | Discharge status: Home / Self Care | Payer: Medicaid Other | Attending: Emergency Medicine | Admitting: Emergency Medicine

## 2023-10-02 ENCOUNTER — Other Ambulatory Visit: Payer: Self-pay

## 2023-10-02 ENCOUNTER — Encounter (HOSPITAL_BASED_OUTPATIENT_CLINIC_OR_DEPARTMENT_OTHER): Payer: Self-pay

## 2023-10-02 ENCOUNTER — Emergency Department (HOSPITAL_BASED_OUTPATIENT_CLINIC_OR_DEPARTMENT_OTHER): Payer: Medicaid Other

## 2023-10-02 DIAGNOSIS — L03011 Cellulitis of right finger: Secondary | ICD-10-CM | POA: Insufficient documentation

## 2023-10-02 DIAGNOSIS — M79644 Pain in right finger(s): Secondary | ICD-10-CM | POA: Diagnosis present

## 2023-10-02 MED ORDER — CEPHALEXIN 250 MG PO CAPS
500.0000 mg | ORAL_CAPSULE | Freq: Once | ORAL | Status: AC
  Administered 2023-10-02: 500 mg via ORAL
  Filled 2023-10-02: qty 2

## 2023-10-02 MED ORDER — LIDOCAINE HCL (PF) 1 % IJ SOLN
5.0000 mL | Freq: Once | INTRAMUSCULAR | Status: AC
  Administered 2023-10-02: 5 mL
  Filled 2023-10-02: qty 5

## 2023-10-02 MED ORDER — CEPHALEXIN 500 MG PO CAPS: 500.0000 mg | ORAL_CAPSULE | Freq: Four times a day (QID) | ORAL | 0 refills | Status: DC

## 2023-10-02 NOTE — ED Triage Notes (Signed)
Pt arrives with c/o right ring finger pain that started a day ago. Pt has swelling to ring finger.

## 2023-10-03 NOTE — ED Provider Notes (Signed)
[place] [facility] AT [facility] Provider Note  CSN: [id number] Arrival date & time: 10/02/23 2031  Chief Complaint(s) Finger Pain  HPI Carly Murray is a 37 y.o. female presenting to the emergency department the right ring finger pain.  She reports this started yesterday.  She reports swelling.  No fevers.  No injuries.   Past Medical History Past Medical History:  Diagnosis Date   Obesity    Recurrent infection of skin    There are no active problems to display for this patient.  Home Medication(s) Prior to Admission medications   Medication Sig Start Date End Date Taking? Authorizing Provider  cephALEXin (KEFLEX) 500 MG capsule Take 1 capsule (500 mg total) by mouth 4 (four) times daily. 10/02/23  Yes Lonell Grandchild, MD  cyclobenzaprine (FLEXERIL) 10 MG tablet Take 1 tablet (10 mg total) by mouth 2 (two) times daily as needed for muscle spasms. 05/05/23   Schutt, Edsel Petrin, PA-C  Semaglutide-Weight Management 0.25 MG/0.5ML SOAJ Inject 0.25 mg into the skin once a week at 9am 06/20/23     WEGOVY 1.7 MG/0.75ML SOAJ Inject 1.7 mg into the skin once a week. 09/30/23                                                                                                                                       Past Surgical History Past Surgical History:  Procedure Laterality Date   KNEE SURGERY     Family History History reviewed. No pertinent family history.  Social History Social History   Tobacco Use   Smoking status: Never   Smokeless tobacco: Never  Substance Use Topics   Alcohol use: Yes    Comment: occ   Drug use: Never   Allergies Patient has no known allergies.  Review of Systems Review of Systems  All other systems reviewed and are negative.   Physical Exam Vital Signs  I have reviewed the triage vital signs BP (!) 141/91 (BP Location: Left Arm)   Pulse 77   Temp 98 F (36.7 C)   Resp 16   Wt 133.8 kg   SpO2 97%   BMI  53.96 kg/m  Physical Exam Vitals and nursing note reviewed.  Constitutional:      Appearance: Normal appearance.  HENT:     Head: Normocephalic and atraumatic.     Mouth/Throat:     Mouth: Mucous membranes are moist.  Eyes:     Conjunctiva/sclera: Conjunctivae normal.  Cardiovascular:     Rate and Rhythm: Normal rate.  Pulmonary:     Effort: Pulmonary effort is normal. No respiratory distress.  Abdominal:     General: Abdomen is flat.  Musculoskeletal:        General: No deformity.     Comments: Swelling to the medial aspect of the right ring finger with tenderness  Skin:    General: Skin is warm and  dry.     Capillary Refill: Capillary refill takes less than 2 seconds.  Neurological:     General: No focal deficit present.     Mental Status: She is alert. Mental status is at baseline.  Psychiatric:        Mood and Affect: Mood normal.        Behavior: Behavior normal.     ED Results and Treatments Labs (all labs ordered are listed, but only abnormal results are displayed) Labs Reviewed - No data to display                                                                                                                        Radiology DG Finger Ring Right Result Date: 10/02/2023 CLINICAL DATA:  Finger pain, ingrown nail with swelling around nail bed. EXAM: RIGHT RING FINGER 2+V COMPARISON:  None Available. FINDINGS: There is no evidence of fracture or dislocation. No bony erosions or periosteal elevation is seen. There is no evidence of arthropathy or other focal bone abnormality. Soft tissue swelling is present about the distal fourth digit. IMPRESSION: No acute osseous abnormality. Electronically Signed   By: Thornell Sartorius M.D.   On: 10/02/2023 22:12    Pertinent labs & imaging results that were available during my care of the patient were reviewed by me and considered in my medical decision making (see MDM for details).  Medications Ordered in ED Medications   lidocaine (PF) (XYLOCAINE) 1 % injection 5 mL (5 mLs Other Given 10/02/23 2307)  cephALEXin (KEFLEX) capsule 500 mg (500 mg Oral Given 10/02/23 2347)                                                                                                                                     Procedures .Incision and Drainage  Date/Time: 10/03/2023 12:08 AM  Performed by: Lonell Grandchild, MD Authorized by: Lonell Grandchild, MD   Consent:    Consent obtained:  Verbal   Consent given by:  Patient   Risks, benefits, and alternatives were discussed: yes     Risks discussed:  Bleeding, damage to other organs, infection, incomplete drainage and pain   Alternatives discussed:  No treatment Universal protocol:    Procedure explained and questions answered to patient or proxy's satisfaction: yes     Patient identity confirmed:  Verbally with patient and arm band Location:    Size:  Paronychia Pre-procedure details:    Skin preparation:  Chlorhexidine with alcohol Anesthesia:    Anesthesia method:  Nerve block   Block needle gauge:  25 G   Block anesthetic:  Lidocaine 1% w/o epi   Block outcome:  Anesthesia achieved Procedure type:    Complexity:  Simple Procedure details:    Incision types:  Stab incision   Drainage:  Bloody   Drainage amount:  Scant Post-procedure details:    Procedure completion:  Tolerated well, no immediate complications   (including critical care time)  Medical Decision Making / ED Course   MDM:  37 year old with swelling to the right index finger.  Exam concerning for possible paronychia.  No evidence of ingrown nail.  Perform digital block and performed a single incision without significant purulent drainage.  Area of swelling is relatively small so did not feel that extending the incision would be beneficial.  Recommended trial of antibiotics, warm compresses and soak.  No evidence of any dangerous process or other process such as felon, flexor  tenosynovitis, necrotizing infection, or other dangerous process. x-rays negative.   Will discharge patient to home. All questions answered. Patient comfortable with plan of discharge. Return precautions discussed with patient and specified on the after visit summary.     Imaging Studies ordered: I ordered imaging studies including XR finger On my interpretation imaging demonstrates no acute process I independently visualized and interpreted imaging. I agree with the radiologist interpretation   Medicines ordered and prescription drug management: Meds ordered this encounter  Medications   lidocaine (PF) (XYLOCAINE) 1 % injection 5 mL   cephALEXin (KEFLEX) capsule 500 mg   cephALEXin (KEFLEX) 500 MG capsule    Sig: Take 1 capsule (500 mg total) by mouth 4 (four) times daily.    Dispense:  28 capsule    Refill:  0    -I have reviewed the patients home medicines and have made adjustments as needed     Reevaluation: After the interventions noted above, I reevaluated the patient and found that their symptoms have improved  Co morbidities that complicate the patient evaluation  Past Medical History:  Diagnosis Date   Obesity    Recurrent infection of skin       Dispostion: Disposition decision including need for hospitalization was considered, and patient discharged from emergency department.    Final Clinical Impression(s) / ED Diagnoses Final diagnoses:  Paronychia of finger of right hand     This chart was dictated using voice recognition software.  Despite best efforts to proofread,  errors can occur which can change the documentation meaning.    Lonell Grandchild, MD 10/03/23 Burna Mortimer

## 2023-10-06 ENCOUNTER — Other Ambulatory Visit: Payer: Self-pay

## 2023-10-06 ENCOUNTER — Encounter (HOSPITAL_BASED_OUTPATIENT_CLINIC_OR_DEPARTMENT_OTHER): Payer: Self-pay

## 2023-10-06 DIAGNOSIS — L03011 Cellulitis of right finger: Secondary | ICD-10-CM | POA: Diagnosis present

## 2023-10-06 NOTE — ED Triage Notes (Signed)
Pt arrives with c/o right ring finger pain that has worsened over the past couple of days. Pt seen for the same a couple of days ago and given antibiotics. Pt has swelling and discoloration to finger now.

## 2023-10-07 ENCOUNTER — Emergency Department (HOSPITAL_BASED_OUTPATIENT_CLINIC_OR_DEPARTMENT_OTHER)
Admission: EM | Admit: 2023-10-07 | Discharge: 2023-10-07 | Disposition: A | Discharge status: Home / Self Care | Payer: Medicaid Other | Attending: Emergency Medicine | Admitting: Emergency Medicine

## 2023-10-07 DIAGNOSIS — L03011 Cellulitis of right finger: Secondary | ICD-10-CM

## 2023-10-07 MED ORDER — ACETAMINOPHEN 500 MG PO TABS: 1000.0000 mg | ORAL_TABLET | Freq: Once | ORAL | Status: DC

## 2023-10-07 NOTE — ED Provider Notes (Signed)
[place] [facility] AT [facility] Provider Note  CSN: [id number] Arrival date & time: 10/06/23 2124  Chief Complaint(s) Finger Pain  HPI Carly Murray is a 37 y.o. female here for persistent right ring finger paronychia that was drained on December 18.  The patient reports that she has done a few warm soaks.  Has noted the swelling and pain returned. Compliant with Abx previously Rx'd  HPI  Past Medical History Past Medical History:  Diagnosis Date   Obesity    Recurrent infection of skin    There are no active problems to display for this patient.  Home Medication(s) Prior to Admission medications   Medication Sig Start Date End Date Taking? Authorizing Provider  cephALEXin (KEFLEX) 500 MG capsule Take 1 capsule (500 mg total) by mouth 4 (four) times daily. 10/02/23   Lonell Grandchild, MD  cyclobenzaprine (FLEXERIL) 10 MG tablet Take 1 tablet (10 mg total) by mouth 2 (two) times daily as needed for muscle spasms. 05/05/23   Schutt, Edsel Petrin, PA-C  Semaglutide-Weight Management 0.25 MG/0.5ML SOAJ Inject 0.25 mg into the skin once a week at 9am 06/20/23     WEGOVY 1.7 MG/0.75ML SOAJ Inject 1.7 mg into the skin once a week. 09/30/23                                                                                                                                       Allergies Patient has no known allergies.  Review of Systems Review of Systems As noted in HPI  Physical Exam Vital Signs  I have reviewed the triage vital signs BP (!) 164/109 (BP Location: Left Arm)   Pulse 87   Temp 98.7 F (37.1 C) (Oral)   Resp 20   Wt 133.8 kg   SpO2 100%   BMI 53.96 kg/m   Physical Exam Vitals reviewed.  Constitutional:      General: She is not in acute distress.    Appearance: She is well-developed. She is obese. She is not diaphoretic.  HENT:     Head: Normocephalic and atraumatic.     Right Ear: External ear normal.     Left Ear: External ear  normal.     Nose: Nose normal.  Eyes:     General: No scleral icterus.    Conjunctiva/sclera: Conjunctivae normal.  Neck:     Trachea: Phonation normal.  Cardiovascular:     Rate and Rhythm: Normal rate and regular rhythm.  Pulmonary:     Effort: Pulmonary effort is normal. No respiratory distress.     Breath sounds: No stridor.  Abdominal:     General: There is no distension.  Musculoskeletal:        General: Normal range of motion.       Hands:     Cervical back: Normal range of motion.  Neurological:     Mental Status: She is  alert and oriented to person, place, and time.  Psychiatric:        Behavior: Behavior normal.     ED Results and Treatments Labs (all labs ordered are listed, but only abnormal results are displayed) Labs Reviewed - No data to display                                                                                                                       EKG  EKG Interpretation Date/Time:    Ventricular Rate:    PR Interval:    QRS Duration:    QT Interval:    QTC Calculation:   R Axis:      Text Interpretation:         Radiology No results found.  Medications Ordered in ED Medications - No data to display Procedures Drain paronychia  Date/Time: 10/07/2023 12:18 AM  Performed by: Nira Conn, MD Authorized by: Nira Conn, MD  Consent: Verbal consent obtained. Consent given by: patient Patient identity confirmed: verbally with patient Time out: Immediately prior to procedure a "time out" was called to verify the correct patient, procedure, equipment, support staff and site/side marked as required. Preparation: Patient was prepped and draped in the usual sterile fashion. Local anesthesia used: no  Anesthesia: Local anesthesia used: no  Sedation: Patient sedated: no  Patient tolerance: patient tolerated the procedure well with no immediate complications     (including critical care time) Medical  Decision Making / ED Course   Medical Decision Making   Recurrent paronychia that was drained with an 18-gauge needle as above. Recommended more frequent warm soaks and completion of antibiotic.    Final Clinical Impression(s) / ED Diagnoses Final diagnoses:  Paronychia of finger of right hand   The patient appears reasonably screened and/or stabilized for discharge and I doubt any other medical condition or other [facility] requiring further screening, evaluation, or treatment in the ED at this time. I have discussed the findings, Dx and Tx plan with the patient/family who expressed understanding and agree(s) with the plan. Discharge instructions discussed at length. The patient/family was given strict return precautions who verbalized understanding of the instructions. No further questions at time of discharge.  Disposition: Discharge  Condition: Good  ED Discharge Orders     None      Follow Up: Center, [facility] [address] [place] [address] [phone]  Call  to schedule an appointment for close follow up    This chart was dictated using voice recognition software.  Despite best efforts to proofread,  errors can occur which can change the documentation meaning.    Nira Conn, MD 10/07/23 [phone]

## 2023-10-20 ENCOUNTER — Encounter (HOSPITAL_BASED_OUTPATIENT_CLINIC_OR_DEPARTMENT_OTHER): Payer: Self-pay

## 2023-10-20 ENCOUNTER — Emergency Department (HOSPITAL_BASED_OUTPATIENT_CLINIC_OR_DEPARTMENT_OTHER)
Admission: EM | Admit: 2023-10-20 | Discharge: 2023-10-20 | Disposition: A | Discharge status: Home / Self Care | Payer: No Typology Code available for payment source | Attending: Emergency Medicine | Admitting: Emergency Medicine

## 2023-10-20 ENCOUNTER — Other Ambulatory Visit: Payer: Self-pay

## 2023-10-20 DIAGNOSIS — M542 Cervicalgia: Secondary | ICD-10-CM | POA: Diagnosis present

## 2023-10-20 DIAGNOSIS — Y92410 Unspecified street and highway as the place of occurrence of the external cause: Secondary | ICD-10-CM | POA: Diagnosis not present

## 2023-10-20 DIAGNOSIS — M791 Myalgia, unspecified site: Secondary | ICD-10-CM | POA: Insufficient documentation

## 2023-10-20 MED ORDER — METHOCARBAMOL 500 MG PO TABS: 500.0000 mg | ORAL_TABLET | Freq: Two times a day (BID) | ORAL | 0 refills | Status: DC
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
## 2023-10-21 ENCOUNTER — Encounter (HOSPITAL_BASED_OUTPATIENT_CLINIC_OR_DEPARTMENT_OTHER): Payer: Self-pay | Admitting: Emergency Medicine

## 2023-10-21 ENCOUNTER — Emergency Department (HOSPITAL_BASED_OUTPATIENT_CLINIC_OR_DEPARTMENT_OTHER): Payer: Medicaid Other

## 2023-10-21 ENCOUNTER — Emergency Department (HOSPITAL_BASED_OUTPATIENT_CLINIC_OR_DEPARTMENT_OTHER)
Admission: EM | Admit: 2023-10-21 | Discharge: 2023-10-21 | Disposition: A | Discharge status: Home / Self Care | Payer: Medicaid Other | Attending: Emergency Medicine | Admitting: Emergency Medicine

## 2023-10-21 ENCOUNTER — Other Ambulatory Visit: Payer: Self-pay

## 2023-10-21 DIAGNOSIS — Y92410 Unspecified street and highway as the place of occurrence of the external cause: Secondary | ICD-10-CM | POA: Diagnosis not present

## 2023-10-21 DIAGNOSIS — R0781 Pleurodynia: Secondary | ICD-10-CM | POA: Insufficient documentation

## 2023-10-21 LAB — PREGNANCY, URINE: Preg Test, Ur: NEGATIVE

## 2023-10-21 MED ORDER — TRAMADOL HCL 50 MG PO TABS: 50.0000 mg | ORAL_TABLET | Freq: Four times a day (QID) | ORAL | 0 refills | Status: DC | PRN

## 2023-10-21 MED ORDER — HYDROCODONE-ACETAMINOPHEN 5-325 MG PO TABS
1.0000 | ORAL_TABLET | Freq: Once | ORAL | Status: AC
  Administered 2023-10-21: 1 via ORAL
  Filled 2023-10-21: qty 1

## 2023-10-21 MED ORDER — NAPROXEN 500 MG PO TABS: 500.0000 mg | ORAL_TABLET | Freq: Two times a day (BID) | ORAL | 0 refills | Status: DC

## 2023-10-21 NOTE — Discharge Instructions (Signed)

## 2023-10-21 NOTE — ED Triage Notes (Signed)
 Left flank pain x 1 day , was in [facility] yesterday and was seen here for that . Denies urinary symptoms or NV .
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
## 2023-10-21 NOTE — ED Notes (Signed)
 Patient ambulates to the bathroom with a steady gait requiring no assistance

## 2023-10-29 ENCOUNTER — Other Ambulatory Visit (HOSPITAL_COMMUNITY): Payer: Self-pay

## 2023-10-29 MED ORDER — WEGOVY 1.7 MG/0.75ML ~~LOC~~ SOAJ
1.7000 mg | SUBCUTANEOUS | 2 refills | Status: DC
  Filled 2023-10-29: qty 3, 28d supply, fill #0

## 2023-11-07 ENCOUNTER — Other Ambulatory Visit (HOSPITAL_COMMUNITY): Payer: Self-pay

## 2023-12-02 ENCOUNTER — Other Ambulatory Visit (HOSPITAL_COMMUNITY): Payer: Self-pay

## 2023-12-02 MED ORDER — WEGOVY 1.7 MG/0.75ML ~~LOC~~ SOAJ
0.7500 mL | SUBCUTANEOUS | 2 refills | Status: DC
  Filled 2023-12-02: qty 3, 28d supply, fill #0

## 2023-12-30 ENCOUNTER — Other Ambulatory Visit (HOSPITAL_COMMUNITY): Payer: Self-pay

## 2023-12-30 MED ORDER — WEGOVY 2.4 MG/0.75ML ~~LOC~~ SOAJ
2.4000 mg | SUBCUTANEOUS | 0 refills | Status: DC
  Filled 2023-12-30: qty 3, 28d supply, fill #0

## 2023-12-31 ENCOUNTER — Other Ambulatory Visit (HOSPITAL_COMMUNITY): Payer: Self-pay

## 2024-02-03 ENCOUNTER — Emergency Department (HOSPITAL_BASED_OUTPATIENT_CLINIC_OR_DEPARTMENT_OTHER)

## 2024-02-03 ENCOUNTER — Other Ambulatory Visit: Payer: Self-pay

## 2024-02-03 ENCOUNTER — Emergency Department (HOSPITAL_BASED_OUTPATIENT_CLINIC_OR_DEPARTMENT_OTHER)
Admission: EM | Admit: 2024-02-03 | Discharge: 2024-02-03 | Discharge status: Against Medical Advice | Attending: Emergency Medicine | Admitting: Emergency Medicine

## 2024-02-03 ENCOUNTER — Encounter (HOSPITAL_BASED_OUTPATIENT_CLINIC_OR_DEPARTMENT_OTHER): Payer: Self-pay | Admitting: Emergency Medicine

## 2024-02-03 DIAGNOSIS — D72829 Elevated white blood cell count, unspecified: Secondary | ICD-10-CM | POA: Diagnosis not present

## 2024-02-03 DIAGNOSIS — Z5329 Procedure and treatment not carried out because of patient's decision for other reasons: Secondary | ICD-10-CM | POA: Insufficient documentation

## 2024-02-03 DIAGNOSIS — O26891 Other specified pregnancy related conditions, first trimester: Secondary | ICD-10-CM | POA: Diagnosis present

## 2024-02-03 DIAGNOSIS — R1030 Lower abdominal pain, unspecified: Secondary | ICD-10-CM | POA: Insufficient documentation

## 2024-02-03 DIAGNOSIS — Z3201 Encounter for pregnancy test, result positive: Secondary | ICD-10-CM

## 2024-02-03 DIAGNOSIS — Z3A01 Less than 8 weeks gestation of pregnancy: Secondary | ICD-10-CM | POA: Diagnosis not present

## 2024-02-03 LAB — CBC WITH DIFFERENTIAL/PLATELET
Abs Immature Granulocytes: 0.03 10*3/uL (ref 0.00–0.07)
Basophils Absolute: 0.1 10*3/uL (ref 0.0–0.1)
Basophils Relative: 1 %
Eosinophils Absolute: 0.1 10*3/uL (ref 0.0–0.5)
Eosinophils Relative: 1 %
HCT: 44.6 % (ref 36.0–46.0)
Hemoglobin: 15.2 g/dL — ABNORMAL HIGH (ref 12.0–15.0)
Immature Granulocytes: 0 %
Lymphocytes Relative: 42 %
Lymphs Abs: 4.5 10*3/uL — ABNORMAL HIGH (ref 0.7–4.0)
MCH: 30 pg (ref 26.0–34.0)
MCHC: 34.1 g/dL (ref 30.0–36.0)
MCV: 88 fL (ref 80.0–100.0)
Monocytes Absolute: 0.7 10*3/uL (ref 0.1–1.0)
Monocytes Relative: 6 %
Neutro Abs: 5.3 10*3/uL (ref 1.7–7.7)
Neutrophils Relative %: 50 %
Platelets: 379 10*3/uL (ref 150–400)
RBC: 5.07 MIL/uL (ref 3.87–5.11)
RDW: 13.1 % (ref 11.5–15.5)
WBC: 10.6 10*3/uL — ABNORMAL HIGH (ref 4.0–10.5)
nRBC: 0 % (ref 0.0–0.2)

## 2024-02-03 LAB — URINALYSIS, MICROSCOPIC (REFLEX): RBC / HPF: NONE SEEN RBC/hpf (ref 0–5)

## 2024-02-03 LAB — URINALYSIS, ROUTINE W REFLEX MICROSCOPIC
Bilirubin Urine: NEGATIVE
Glucose, UA: NEGATIVE mg/dL
Hgb urine dipstick: NEGATIVE
Ketones, ur: NEGATIVE mg/dL
Nitrite: NEGATIVE
Protein, ur: NEGATIVE mg/dL
Specific Gravity, Urine: >=1.03 (ref 1.005–1.030)
pH: 6 (ref 5.0–8.0)

## 2024-02-03 LAB — COMPREHENSIVE METABOLIC PANEL WITH GFR
ALT: 15 U/L (ref 0–44)
AST: 14 U/L — ABNORMAL LOW (ref 15–41)
Albumin: 3.5 g/dL (ref 3.5–5.0)
Alkaline Phosphatase: 70 U/L (ref 38–126)
Anion gap: 11 (ref 5–15)
BUN: 7 mg/dL (ref 6–20)
CO2: 21 mmol/L — ABNORMAL LOW (ref 22–32)
Calcium: 9 mg/dL (ref 8.9–10.3)
Chloride: 103 mmol/L (ref 98–111)
Creatinine, Ser: 0.62 mg/dL (ref 0.44–1.00)
GFR, Estimated: >60 mL/min (ref >60)
Glucose, Bld: 89 mg/dL (ref 70–99)
Potassium: 3.6 mmol/L (ref 3.5–5.1)
Sodium: 135 mmol/L (ref 135–145)
Total Bilirubin: 0.4 mg/dL (ref 0.0–1.2)
Total Protein: 7.7 g/dL (ref 6.5–8.1)

## 2024-02-03 LAB — HCG, QUANTITATIVE, PREGNANCY: hCG, Beta Chain, Quant, S: 17958 m[IU]/mL — ABNORMAL HIGH (ref <5)

## 2024-02-03 LAB — PREGNANCY, URINE: Preg Test, Ur: POSITIVE — AB

## 2024-02-03 LAB — ABO/RH: ABO/RH(D): A POS

## 2024-02-03 NOTE — ED Triage Notes (Addendum)
 Lower abd pain x 2 weeks , denies urinary symptoms , missed her menses this month . positive preg test at home

## 2024-02-03 NOTE — ED Provider Notes (Signed)
 [place] [facility] Provider Note   CSN: [id number] Arrival date & time: 02/03/24  1337     History  Chief Complaint  Patient presents with   Abdominal Pain    Carly Murray is a 38 y.o. female.   Abdominal Pain   38 year old female presents emergency department with complaints of lower abdominal pain.  States has been intermittently present for the past couple of weeks.  Reports end of last menstrual period sometime during the first week of March.  States that she is usually regular occurring every month.  Had 2 at home positive pregnancy test.  Reports 2 episodes of vaginal spotting yesterday accompanying lower abdominal pain.  States the pain is intermittent lasting 20 to 30 minutes at a time when it is present.  Has taken Tylenol  as well as Aleve  for her symptoms.  Denies any fevers, chills, nausea, vomiting, urinary symptoms, vaginal discharge, change in bowel habits.  Does report 1 prior pregnancy 16 years ago with successful delivery of her son.  States that she is currently not having pain.  Has not establish care with OB/GYN for this pregnancy.  Past medical history significant for recurrent skin infection, obesity,  Home Medications Prior to Admission medications   Medication Sig Start Date End Date Taking? Authorizing Provider  cephALEXin  (KEFLEX ) 500 MG capsule Take 1 capsule (500 mg total) by mouth 4 (four) times daily. 10/02/23   Mordecai Applebaum, MD  cyclobenzaprine  (FLEXERIL ) 10 MG tablet Take 1 tablet (10 mg total) by mouth 2 (two) times daily as needed for muscle spasms. 05/05/23   Schutt, Coni Deep, PA-C  methocarbamol  (ROBAXIN ) 500 MG tablet Take 1 tablet (500 mg total) by mouth 2 (two) times daily. 10/20/23   Smoot, Genevive Ket, PA-C  naproxen  (NAPROSYN ) 500 MG tablet Take 1 tablet (500 mg total) by mouth 2 (two) times daily. 10/21/23   Henderly, Britni A, PA-C  Semaglutide -Weight Management 0.25 MG/0.5ML SOAJ Inject 0.25 mg  into the skin once a week at 9am 06/20/23     traMADol  (ULTRAM ) 50 MG tablet Take 1 tablet (50 mg total) by mouth every 6 (six) hours as needed. 10/21/23   Henderly, Britni A, PA-C  WEGOVY  1.7 MG/0.75ML SOAJ Inject 1.7 mg into the skin once a week. 09/30/23     WEGOVY  1.7 MG/0.75ML SOAJ Inject 1.7 mg into the skin once a week. 10/28/23     WEGOVY  2.4 MG/0.75ML SOAJ Inject 2.4 mg into the skin once a week. 12/30/23         Allergies    Patient has no known allergies.    Review of Systems   Review of Systems  Gastrointestinal:  Positive for abdominal pain.  All other systems reviewed and are negative.   Physical Exam Updated Vital Signs BP 138/82   Pulse 77   Temp 97.8 F (36.6 C) (Oral)   Resp 20   Wt 117.9 kg   LMP 12/17/2023 (Exact Date)   SpO2 100%   BMI 47.55 kg/m  Physical Exam Vitals and nursing note reviewed.  Constitutional:      General: She is not in acute distress.    Appearance: She is well-developed.  HENT:     Head: Normocephalic and atraumatic.  Eyes:     Conjunctiva/sclera: Conjunctivae normal.  Cardiovascular:     Rate and Rhythm: Normal rate and regular rhythm.     Heart sounds: No murmur heard. Pulmonary:     Effort: Pulmonary effort is  normal. No respiratory distress.     Breath sounds: Normal breath sounds.  Abdominal:     Palpations: Abdomen is soft.     Tenderness: There is no abdominal tenderness.  Musculoskeletal:        General: No swelling.     Cervical back: Neck supple.  Skin:    General: Skin is warm and dry.     Capillary Refill: Capillary refill takes less than 2 seconds.  Neurological:     Mental Status: She is alert.  Psychiatric:        Mood and Affect: Mood normal.     ED Results / Procedures / Treatments   Labs (all labs ordered are listed, but only abnormal results are displayed) Labs Reviewed  PREGNANCY, URINE - Abnormal; Notable for the following components:      Result Value   Preg Test, Ur POSITIVE (*)    All other  components within normal limits  HCG, QUANTITATIVE, PREGNANCY - Abnormal; Notable for the following components:   hCG, Beta Chain, Quant, S 17,958 (*)    All other components within normal limits  CBC WITH DIFFERENTIAL/PLATELET - Abnormal; Notable for the following components:   WBC 10.6 (*)    Hemoglobin 15.2 (*)    Lymphs Abs 4.5 (*)    All other components within normal limits  COMPREHENSIVE METABOLIC PANEL WITH GFR - Abnormal; Notable for the following components:   CO2 21 (*)    AST 14 (*)    All other components within normal limits  URINALYSIS, ROUTINE W REFLEX MICROSCOPIC - Abnormal; Notable for the following components:   APPearance CLOUDY (*)    Leukocytes,Ua SMALL (*)    All other components within normal limits  URINALYSIS, MICROSCOPIC (REFLEX) - Abnormal; Notable for the following components:   Bacteria, UA FEW (*)    All other components within normal limits  ABO/RH    EKG None  Radiology No results found.  Procedures Procedures    Medications Ordered in ED Medications - No data to display  ED Course/ Medical Decision Making/ A&P Clinical Course as of 02/03/24 2013  Mon Feb 03, 2024  2007 Patient was reportedly told nurse not assigned to the patient that she was leaving.  Patient no longer wanted to stay for ultrasound imaging results.  Unable to have conversation with patient personally along with the nurse assigned to patient.  Unable to have ama conversation with patient. [CR]    Clinical Course User Index [CR] Purcell Butter, PA                                 Medical Decision Making Amount and/or Complexity of Data Reviewed Labs: ordered.   This patient presents to the ED for concern of abdominal pain, this involves an extensive number of treatment options, and is a complaint that carries with it a high risk of complications and morbidity.  The differential diagnosis includes gastritis, PUD, cholecystitis, CBD pathology, ectopic pregnancy and  ovarian torsion, tubo-ovarian abscess, intrauterine pregnancy, round ligament pain, pyonephritis, nephrolithiasis, cystitis diverticulitis, appendicitis, other   Co morbidities that complicate the patient evaluation  See HPI   Additional history obtained:  Additional history obtained from EMR External records from outside source obtained and reviewed including hospital records   Lab Tests:  I Ordered, and personally interpreted labs.  The pertinent results include: Leukocytosis of 10.6.  No evidence of anemia.  Platelets within range.  Mild decrease in bicarb of 21 otherwise, electrolytes within the limits.  No transaminitis.  No renal dysfunction.  Urine pregnancy negative with hCG quant of 17,958.  UA with few bacteria, 6-10 WBCs with small leukocytes.  ABO Rh: Pending   Imaging Studies ordered:  I ordered imaging studies including pelvic ultrasound which is pending upon patient deciding to leave   Cardiac Monitoring: / EKG:  The patient was maintained on a cardiac monitor.  I personally viewed and interpreted the cardiac monitored which showed an underlying rhythm of: Sinus rhythm   Consultations Obtained:  N/a   Problem List / ED Course / Critical interventions / Medication management  Lower abdominal pain Reevaluation of the patient showed that the patient stayed the same I have reviewed the patients home medicines and have made adjustments as needed   Social Determinants of Health:  Denies tobacco, illicit drug use.   Test / Admission - Considered:  Lower abdominal pain Vitals signs  within normal range and stable throughout visit. Laboratory/imaging studies significant for: See above 38 year old female presents emergency department with complaints of lower abdominal pain.  States has been intermittently present for the past couple of weeks.  Reports end of last menstrual period sometime during the first week of March.  States that she is usually regular  occurring every month.  Had 2 at home positive pregnancy test.  Reports 2 episodes of vaginal spotting yesterday accompanying lower abdominal pain.  States the pain is intermittent lasting 20 to 30 minutes at a time when it is present.  Has taken Tylenol  as well as Aleve  for her symptoms.  Denies any fevers, chills, nausea, vomiting, urinary symptoms, vaginal discharge, change in bowel habits.  Does report 1 prior pregnancy 16 years ago with successful delivery of her son.  States that she is currently not having pain.  Has not establish care with OB/GYN for this pregnancy. On exam, no lower abdominal tenderness appreciated as patient currently not experiencing pain.  Labs concerning for slight leukocytosis of 10.6 but otherwise reassuring without evidence of acute emergent process.  hCG on trend with patient's LMP of around 18,000.  While waiting for ultrasound imaging, patient elected to leave prior to alerting assigned nurse or provider due to prolonged radiology read times.  Patient now in the ED near 7 hours..  Unable to have AMA conversation this patient was gone prior to being told by nursing staff.  Unable to have follow-up conversation with patient regarding follow-up recommendations.        Final Clinical Impression(s) / ED Diagnoses Final diagnoses:  None    Rx / DC Orders ED Discharge Orders     None         [place] Butter, [place] 02/03/24 2016    Tegeler, Marine Sia, MD 02/03/24 [phone]

## 2024-02-03 NOTE — ED Notes (Signed)
 Patient requested I remove her IV because "I'm leaving now. I'm not waiting for my results."

## 2024-02-03 NOTE — ED Notes (Signed)
 Pt. Reports abd. Cramping for 2 weeks and bleeding yesterday with only spotting

## 2024-02-04 ENCOUNTER — Emergency Department (HOSPITAL_BASED_OUTPATIENT_CLINIC_OR_DEPARTMENT_OTHER)
Admission: EM | Admit: 2024-02-04 | Discharge: 2024-02-04 | Disposition: A | Discharge status: Home / Self Care | Attending: Emergency Medicine | Admitting: Emergency Medicine

## 2024-02-04 ENCOUNTER — Other Ambulatory Visit: Payer: Self-pay

## 2024-02-04 ENCOUNTER — Encounter (HOSPITAL_BASED_OUTPATIENT_CLINIC_OR_DEPARTMENT_OTHER): Payer: Self-pay | Admitting: Emergency Medicine

## 2024-02-04 DIAGNOSIS — O209 Hemorrhage in early pregnancy, unspecified: Secondary | ICD-10-CM | POA: Diagnosis present

## 2024-02-04 DIAGNOSIS — Z794 Long term (current) use of insulin: Secondary | ICD-10-CM | POA: Insufficient documentation

## 2024-02-04 DIAGNOSIS — R109 Unspecified abdominal pain: Secondary | ICD-10-CM | POA: Insufficient documentation

## 2024-02-04 DIAGNOSIS — Z3A01 Less than 8 weeks gestation of pregnancy: Secondary | ICD-10-CM | POA: Insufficient documentation

## 2024-02-04 NOTE — ED Triage Notes (Signed)
 Pt requesting results from US  she had yesterday.  Wait was too long she reports to wait for them last night.  Just would like someone to go over the results with her.

## 2024-02-04 NOTE — Discharge Instructions (Signed)
 You were seen again in the emergency department after an ultrasound was taken yesterday We reviewed the findings of the ultrasound including visualization of an early intrauterine gestational sac but no cardiac activity at visualized It is recommended that you follow-up with an OB/GYN to discuss this, have repeat blood work and repeat ultrasound taken Please call the number for Dr. Dodie Frees provided to schedule an appointment within next 1 to 2 weeks Return to the emergency room for severe abdominal pain, profuse vaginal bleeding or any other concerns

## 2024-02-04 NOTE — ED Provider Notes (Signed)
 [place] [facility] Provider Note   CSN: [id number] Arrival date & time: 02/04/24  1806     History  Chief Complaint  Patient presents with   Ultrasound Results    Carly Murray  is a 38 year old female G1, P1 who presented initially to the ED for abdominal pain yesterday.  Last menstrual period ended somewhere around March 4.  She had 2 at home positive pregnancy test.  Was seen yesterday for abdominal pain vaginal spotting.  Left prior to her OB ultrasounds being read and is back today for the results.  She is still having some lower abdominal discomfort but otherwise feels okay today  HPI     Home Medications Prior to Admission medications   Medication Sig Start Date End Date Taking? Authorizing Provider  cephALEXin  (KEFLEX ) 500 MG capsule Take 1 capsule (500 mg total) by mouth 4 (four) times daily. 10/02/23   Mordecai Applebaum, MD  cyclobenzaprine  (FLEXERIL ) 10 MG tablet Take 1 tablet (10 mg total) by mouth 2 (two) times daily as needed for muscle spasms. 05/05/23   Schutt, Coni Deep, PA-C  methocarbamol  (ROBAXIN ) 500 MG tablet Take 1 tablet (500 mg total) by mouth 2 (two) times daily. 10/20/23   Smoot, Genevive Ket, PA-C  naproxen  (NAPROSYN ) 500 MG tablet Take 1 tablet (500 mg total) by mouth 2 (two) times daily. 10/21/23   Henderly, Britni A, PA-C  Semaglutide -Weight Management 0.25 MG/0.5ML SOAJ Inject 0.25 mg into the skin once a week at 9am 06/20/23     traMADol  (ULTRAM ) 50 MG tablet Take 1 tablet (50 mg total) by mouth every 6 (six) hours as needed. 10/21/23   Henderly, Britni A, PA-C  WEGOVY  1.7 MG/0.75ML SOAJ Inject 1.7 mg into the skin once a week. 09/30/23     WEGOVY  1.7 MG/0.75ML SOAJ Inject 1.7 mg into the skin once a week. 10/28/23     WEGOVY  2.4 MG/0.75ML SOAJ Inject 2.4 mg into the skin once a week. 12/30/23         Allergies    Patient has no known allergies.    Review of Systems   Review of Systems  Physical Exam Updated  Vital Signs BP (!) 143/96 (BP Location: Right Arm)   Pulse 91   Temp 98.9 F (37.2 C) (Oral)   Resp 18   LMP 12/17/2023 (Exact Date)   SpO2 100%  Physical Exam Vitals and nursing note reviewed.  HENT:     Head: Normocephalic and atraumatic.  Eyes:     Pupils: Pupils are equal, round, and reactive to light.  Cardiovascular:     Rate and Rhythm: Normal rate and regular rhythm.  Pulmonary:     Effort: Pulmonary effort is normal.     Breath sounds: Normal breath sounds.  Abdominal:     Palpations: Abdomen is soft.     Tenderness: There is no abdominal tenderness.  Skin:    General: Skin is warm and dry.  Neurological:     Mental Status: She is alert.  Psychiatric:        Mood and Affect: Mood normal.     ED Results / Procedures / Treatments   Labs (all labs ordered are listed, but only abnormal results are displayed) Labs Reviewed - No data to display  EKG None  Radiology US  OB LESS THAN 14 WEEKS WITH OB TRANSVAGINAL Result Date: 02/03/2024 CLINICAL DATA:  Lower abdominal cramping. EXAM: OBSTETRIC <14 WK US  AND TRANSVAGINAL OB US  TECHNIQUE: Both  transabdominal and transvaginal ultrasound examinations were performed for complete evaluation of the gestation as well as the maternal uterus, adnexal regions, and pelvic cul-de-sac. Transvaginal technique was performed to assess early pregnancy. COMPARISON:  None Available. FINDINGS: Intrauterine gestational sac: Single Yolk sac:  Not Visualized. Embryo:  Not Visualized. Cardiac Activity: Not Visualized. Heart Rate: N/A  bpm MSD: 11 mm   5 w   6 d Subchorionic hemorrhage:  None visualized. Maternal uterus/adnexae: Right ovary is visualized and is normal in appearance. A 2.5 cm x 3.3 cm x 2.8 cm simple cyst is seen within the left ovary. A trace amount of pelvic free fluid is noted. IMPRESSION: 1. Probable early intrauterine gestational sac, but no yolk sac, fetal pole, or cardiac activity yet visualized. Recommend follow-up quantitative  B-HCG levels and follow-up US  in 14 days to assess viability. This recommendation follows SRU consensus guidelines: Diagnostic Criteria for Nonviable Pregnancy Early in the First Trimester. Mel Spine Med 2013; 161:0960-45. 2. A simple left ovarian cyst. Electronically Signed   By: Virgle Grime M.D.   On: 02/03/2024 21:20    Procedures Procedures    Medications Ordered in ED Medications - No data to display  ED Course/ Medical Decision Making/ A&P                                 Medical Decision Making 38 year old female with history as above returns after being seen yesterday for results of her OB ultrasound.  Ultrasound showed:   IMPRESSION: 1. Probable early intrauterine gestational sac, but no yolk sac, fetal pole, or cardiac activity yet visualized. Recommend follow-up quantitative B-HCG levels and follow-up US  in 14 days to assess viability. This recommendation follows SRU consensus guidelines: Diagnostic Criteria for Nonviable Pregnancy Early in the First Trimester. Mel Spine Med 2013; 409:8119-14. 2. A simple left ovarian cyst.   I reviewed these findings with her.  She will need a repeat OB visit in the next 1 to 2 weeks.  I will provide her with a number for follow-up.  Return precautions will be worrisome for severe abdominal pain or profuse vaginal bleeding were discussed in detail           Final Clinical Impression(s) / ED Diagnoses Final diagnoses:  Bleeding in early pregnancy    Rx / DC Orders ED Discharge Orders     None         Sallyanne Creamer, DO 02/04/24 2005

## 2024-02-05 ENCOUNTER — Telehealth: Payer: Self-pay

## 2024-02-05 NOTE — Telephone Encounter (Signed)
 Patient just called stating she was in the ED for vaginal bleeding. She needs a repeat hcg and US .   Patient works at [employer] and would prefer to go to [facility] because it is close to her job. Patient stated that she would like to do prenatal care at [facility] instead of [place] due to that reason.

## 2024-02-06 ENCOUNTER — Other Ambulatory Visit: Payer: Self-pay | Admitting: *Deleted

## 2024-02-06 DIAGNOSIS — O3680X0 Pregnancy with inconclusive fetal viability, not applicable or unspecified: Secondary | ICD-10-CM

## 2024-02-13 ENCOUNTER — Ambulatory Visit

## 2024-02-13 ENCOUNTER — Other Ambulatory Visit: Payer: Self-pay

## 2024-02-13 ENCOUNTER — Other Ambulatory Visit

## 2024-02-13 DIAGNOSIS — Z3491 Encounter for supervision of normal pregnancy, unspecified, first trimester: Secondary | ICD-10-CM | POA: Diagnosis not present

## 2024-02-13 DIAGNOSIS — O3680X0 Pregnancy with inconclusive fetal viability, not applicable or unspecified: Secondary | ICD-10-CM

## 2024-02-13 DIAGNOSIS — Z3A01 Less than 8 weeks gestation of pregnancy: Secondary | ICD-10-CM

## 2024-02-14 LAB — BETA HCG QUANT (REF LAB): hCG Quant: 43804 m[IU]/mL

## 2024-02-20 ENCOUNTER — Other Ambulatory Visit: Payer: Self-pay

## 2024-02-20 DIAGNOSIS — O219 Vomiting of pregnancy, unspecified: Secondary | ICD-10-CM

## 2024-02-20 MED ORDER — ONDANSETRON 4 MG PO TBDP: 4.0000 mg | ORAL_TABLET | Freq: Four times a day (QID) | ORAL | 3 refills | Status: DC | PRN

## 2024-03-03 ENCOUNTER — Ambulatory Visit

## 2024-03-03 VITALS — BP 161/89 | HR 75 | Wt 279.0 lb

## 2024-03-03 DIAGNOSIS — O0990 Supervision of high risk pregnancy, unspecified, unspecified trimester: Secondary | ICD-10-CM | POA: Insufficient documentation

## 2024-03-03 DIAGNOSIS — Z3401 Encounter for supervision of normal first pregnancy, first trimester: Secondary | ICD-10-CM | POA: Insufficient documentation

## 2024-03-03 DIAGNOSIS — O0991 Supervision of high risk pregnancy, unspecified, first trimester: Secondary | ICD-10-CM

## 2024-03-03 DIAGNOSIS — Z6841 Body Mass Index (BMI) 40.0 and over, adult: Secondary | ICD-10-CM

## 2024-03-03 DIAGNOSIS — O09529 Supervision of elderly multigravida, unspecified trimester: Secondary | ICD-10-CM

## 2024-03-03 NOTE — Progress Notes (Unsigned)
 New OB Intake  I connected with Carly Murray  on 03/03/24 at  1:00 PM EDT by MyChart Video Visit and verified that I am speaking with the correct person using two identifiers. Nurse is located at [facility] and pt is located at [facility]  I discussed the limitations, risks, security and privacy concerns of performing an evaluation and management service by telephone and the availability of in person appointments. I also discussed with the patient that there may be a patient responsible charge related to this service. The patient expressed understanding and agreed to proceed.  I explained I am completing New OB Intake today. We discussed EDD of 09/29/2024, by Ultrasound. Pt is G2P0101. I reviewed her allergies, medications and Medical/Surgical/OB history.    Patient Active Problem List   Diagnosis Date Noted   Supervision of high risk pregnancy, antepartum 03/03/2024     Concerns addressed today  Delivery Plans Plans to deliver at [facility]. Discussed the nature of our practice with multiple providers including residents and students. Due to the size of the practice, the delivering provider may not be the same as those providing prenatal care.   Patient is not interested in water birth.  MyChart/Babyscripts MyChart access verified. I explained pt will have some visits in office and some virtually. Babyscripts instructions given and order placed. Patient verifies receipt of registration text/e-mail. Account successfully created and app downloaded. If patient is a candidate for Optimized scheduling, add to sticky note.   Blood Pressure Cuff/Weight Scale Pt given b/p cuff in the office. Explained after first prenatal appt pt will check weekly and document in Babyscripts. Patient does have weight scale.  Anatomy US  Explained first scheduled US  will be around 19 weeks. Anatomy US  scheduled for 20 weeks at maternal fetal care..  Is patient a CenteringPregnancy candidate?  Accepted Declined  due to  Not a candidate due to  If accepted,    Is patient a Mom+Baby Combined Care candidate?  Not a candidate   If accepted, confirm patient does not intend to move from the area for at least 12 months, then notify Mom+Baby staff  Is patient a candidate for Babyscripts Optimization? Yes, patient accepted    First visit review I reviewed new OB appt with patient. Explained pt will be seen by Dr. Alto Atta at first visit. Discussed [place] genetic screening with patient.  Panorama and Horizon.. Routine prenatal labs is   Last Pap No results found for: "DIAGPAP"  Silver Achey l Kirklin Mcduffee, CMA 03/03/2024  1:51 PM

## 2024-03-04 LAB — CBC/D/PLT+RPR+RH+ABO+RUBIGG...
Antibody Screen: NEGATIVE
Basophils Absolute: 0.1 10*3/uL (ref 0.0–0.2)
Basos: 1 %
EOS (ABSOLUTE): 0.1 10*3/uL (ref 0.0–0.4)
Eos: 1 %
HCV Ab: NONREACTIVE
HIV Screen 4th Generation wRfx: NONREACTIVE
Hematocrit: 41.1 % (ref 34.0–46.6)
Hemoglobin: 13.8 g/dL (ref 11.1–15.9)
Hepatitis B Surface Ag: NEGATIVE
Immature Grans (Abs): 0 10*3/uL (ref 0.0–0.1)
Immature Granulocytes: 0 %
Lymphocytes Absolute: 4.5 10*3/uL — ABNORMAL HIGH (ref 0.7–3.1)
Lymphs: 41 %
MCH: 30.7 pg (ref 26.6–33.0)
MCHC: 33.6 g/dL (ref 31.5–35.7)
MCV: 91 fL (ref 79–97)
Monocytes Absolute: 0.7 10*3/uL (ref 0.1–0.9)
Monocytes: 7 %
Neutrophils Absolute: 5.6 10*3/uL (ref 1.4–7.0)
Neutrophils: 50 %
Platelets: 357 10*3/uL (ref 150–450)
RBC: 4.5 x10E6/uL (ref 3.77–5.28)
RDW: 12.7 % (ref 11.7–15.4)
RPR Ser Ql: NONREACTIVE
Rh Factor: POSITIVE
Rubella Antibodies, IGG: 6.94 {index} (ref >0.99)
WBC: 11 10*3/uL — ABNORMAL HIGH (ref 3.4–10.8)

## 2024-03-04 LAB — HCV INTERPRETATION

## 2024-03-04 LAB — COMPREHENSIVE METABOLIC PANEL WITH GFR
ALT: 16 IU/L (ref 0–32)
AST: 13 IU/L (ref 0–40)
Albumin: 3.7 g/dL — ABNORMAL LOW (ref 3.9–4.9)
Alkaline Phosphatase: 71 IU/L (ref 44–121)
BUN/Creatinine Ratio: 13 (ref 9–23)
BUN: 8 mg/dL (ref 6–20)
Bilirubin Total: 0.2 mg/dL (ref 0.0–1.2)
CO2: 18 mmol/L — ABNORMAL LOW (ref 20–29)
Calcium: 9.3 mg/dL (ref 8.7–10.2)
Chloride: 103 mmol/L (ref 96–106)
Creatinine, Ser: 0.6 mg/dL (ref 0.57–1.00)
Globulin, Total: 3.1 g/dL (ref 1.5–4.5)
Glucose: 92 mg/dL (ref 70–99)
Potassium: 4.2 mmol/L (ref 3.5–5.2)
Sodium: 137 mmol/L (ref 134–144)
Total Protein: 6.8 g/dL (ref 6.0–8.5)
eGFR: 119 mL/min/{1.73_m2} (ref >59)

## 2024-03-04 LAB — PROTEIN / CREATININE RATIO, URINE
Creatinine, Urine: 67.1 mg/dL
Protein, Ur: 7.5 mg/dL
Protein/Creat Ratio: 112 mg/g{creat} (ref 0–200)

## 2024-03-05 ENCOUNTER — Encounter: Payer: Self-pay | Admitting: Obstetrics and Gynecology

## 2024-03-05 ENCOUNTER — Telehealth: Payer: Self-pay

## 2024-03-05 NOTE — Telephone Encounter (Signed)
 Spoke with patient and provided reassurance regarding lab results.  Carly Murray [employer]

## 2024-03-06 LAB — URINE CULTURE

## 2024-03-10 ENCOUNTER — Other Ambulatory Visit: Payer: Self-pay | Admitting: Obstetrics and Gynecology

## 2024-03-10 DIAGNOSIS — O2340 Unspecified infection of urinary tract in pregnancy, unspecified trimester: Secondary | ICD-10-CM | POA: Insufficient documentation

## 2024-03-10 DIAGNOSIS — O2341 Unspecified infection of urinary tract in pregnancy, first trimester: Secondary | ICD-10-CM

## 2024-03-10 MED ORDER — AMOXICILLIN 500 MG PO CAPS: 500.0000 mg | ORAL_CAPSULE | Freq: Three times a day (TID) | ORAL | 0 refills | Status: AC

## 2024-03-12 ENCOUNTER — Ambulatory Visit: Payer: Self-pay

## 2024-03-26 ENCOUNTER — Other Ambulatory Visit (HOSPITAL_COMMUNITY)
Admission: RE | Admit: 2024-03-26 | Discharge: 2024-03-26 | Disposition: A | Discharge status: Home / Self Care | Source: Ambulatory Visit | Attending: Obstetrics and Gynecology | Admitting: Obstetrics and Gynecology

## 2024-03-26 ENCOUNTER — Ambulatory Visit (INDEPENDENT_AMBULATORY_CARE_PROVIDER_SITE_OTHER): Admitting: Obstetrics and Gynecology

## 2024-03-26 VITALS — BP 147/79 | HR 84 | Wt 281.0 lb

## 2024-03-26 DIAGNOSIS — O09529 Supervision of elderly multigravida, unspecified trimester: Secondary | ICD-10-CM | POA: Insufficient documentation

## 2024-03-26 DIAGNOSIS — Z1331 Encounter for screening for depression: Secondary | ICD-10-CM | POA: Diagnosis not present

## 2024-03-26 DIAGNOSIS — Z3A13 13 weeks gestation of pregnancy: Secondary | ICD-10-CM

## 2024-03-26 DIAGNOSIS — O2341 Unspecified infection of urinary tract in pregnancy, first trimester: Secondary | ICD-10-CM

## 2024-03-26 DIAGNOSIS — O99211 Obesity complicating pregnancy, first trimester: Secondary | ICD-10-CM

## 2024-03-26 DIAGNOSIS — O09899 Supervision of other high risk pregnancies, unspecified trimester: Secondary | ICD-10-CM

## 2024-03-26 DIAGNOSIS — O09891 Supervision of other high risk pregnancies, first trimester: Secondary | ICD-10-CM

## 2024-03-26 DIAGNOSIS — O09521 Supervision of elderly multigravida, first trimester: Secondary | ICD-10-CM | POA: Diagnosis not present

## 2024-03-26 DIAGNOSIS — O09519 Supervision of elderly primigravida, unspecified trimester: Secondary | ICD-10-CM | POA: Insufficient documentation

## 2024-03-26 DIAGNOSIS — O0990 Supervision of high risk pregnancy, unspecified, unspecified trimester: Secondary | ICD-10-CM | POA: Diagnosis present

## 2024-03-26 DIAGNOSIS — O99210 Obesity complicating pregnancy, unspecified trimester: Secondary | ICD-10-CM | POA: Insufficient documentation

## 2024-03-26 MED ORDER — ASPIRIN 81 MG PO TBEC: 81.0000 mg | DELAYED_RELEASE_TABLET | Freq: Every day | ORAL | 2 refills | Status: DC

## 2024-03-26 NOTE — Progress Notes (Signed)
 Chief Complaint  Patient presents with   Routine Prenatal Visit    Subjective:   Carly Murray is a 38 y.o. G2P0101 at [redacted]w[redacted]d by 7 week ultrasound being seen today for her first obstetrical visit.    Her obstetrical history is significant for: AMA Obesity (BMI 51) History of preterm birth: preterm labor & delivery at 47 weeks  Patient does intend to breast feed. Pregnancy history fully reviewed.  Patient reports no complaints.  HISTORY: OB History  Gravida Para Term Preterm AB Living  2 1 0 1 0 1  SAB IAB Ectopic Multiple Live Births  0 0 0 0 1    # Outcome Date GA Lbr Len/2nd Weight Sex Type Anes PTL Lv  2 Current           1 Preterm     M Vag-Spont EPI Y LIV     Last pap smear: No results found for: DIAGPAP, HPV, HPVHIGH   Past Medical History:  Diagnosis Date   Obesity    Recurrent infection of skin    Past Surgical History:  Procedure Laterality Date   KNEE SURGERY     Family History  Problem Relation Age of Onset   Diabetes Mother    COPD Mother    Hypertension Mother    Lupus Mother    Hepatitis C Father    Social History   Tobacco Use   Smoking status: Never   Smokeless tobacco: Never  Substance Use Topics   Alcohol use: Not Currently    Comment: occ   Drug use: Never   No Known Allergies Current Outpatient Medications on File Prior to Visit  Medication Sig Dispense Refill   ondansetron  (ZOFRAN -ODT) 4 MG disintegrating tablet Take 1 tablet (4 mg total) by mouth every 6 (six) hours as needed for nausea. 20 tablet 3   Prenatal Vit-Fe Fumarate-FA (PRENATAL VITAMINS PO) Take 1 capsule by mouth daily.     No current facility-administered medications on file prior to visit.     Exam   Vitals:   03/26/24 0914 03/26/24 0921  BP: (!) 127/104 (!) 147/79  Pulse: 98 84  Weight: 281 lb (127.5 kg)    Fetal Heart Rate (bpm): 156Body mass index is 51.4 kg/m.      General:  Alert, oriented and cooperative. Patient is in no acute  distress.  Breast: deferred  Cardiovascular: Normal heart rate noted  Respiratory: Normal respiratory effort, no problems with respiration noted  Abdomen: Soft, gravid, appropriate for gestational age.  Pain/Pressure: Absent     Pelvic: Pelvic exam in presence of chaperone, EGBUS within normal limits, vagina normal, cervix normal, uterus gravid, no adnexal masses        Extremities: Normal range of motion.  Edema: None  Mental Status: Normal mood and affect. Normal behavior. Normal judgment and thought content.    Assessment:   Pregnancy: G2P0101 Patient Active Problem List   Diagnosis Date Noted   Obesity in pregnancy 03/26/2024   Antepartum multigravida of advanced maternal age 19/09/2024   UTI in pregnancy 03/10/2024   Supervision of high risk pregnancy, antepartum 03/03/2024     Plan:  1. Supervision of high risk pregnancy, antepartum (Primary) - Hemoglobin A1c - HORIZON CUSTOM - PANORAMA PRENATAL TEST - Cytology - PAP - aspirin EC 81 MG tablet; Take 1 tablet (81 mg total) by mouth at bedtime. Start taking when you are [redacted] weeks pregnant for rest of pregnancy for prevention of preeclampsia  Dispense: 300 tablet;  Refill: 2  2. History of preterm delivery, currently pregnant Counseled on risks of recurrent preterm birth, discussed progesterone, patient declines at this time and will consider. Further discussion with MFM at follow up and for cervical length measurements  3. Antepartum multigravida of advanced maternal age Aspirin rx'ed - PANORAMA PRENATAL TEST  4. Obesity in pregnancy Aspirin rx'ed Reviewed weight gain, exercise in pregnancy Plan for serial growth & antenatal testing   5. Urinary tract infection in mother during first trimester of pregnancy TOC next visit, just completed antibiotics 1-2 days ago   Initial labs drawn. Continue prenatal vitamins. Genetic Screening discussed: NIPS, carrier screening and AFP, requested. Ultrasound discussed; fetal anatomic  survey: requested. Problem list reviewed and updated. The nature of  - [facility] Faculty Practice with multiple MDs and other Advanced Practice Providers was explained to patient; also emphasized that residents, students are part of our team. Routine obstetric precautions reviewed. Return in about 4 weeks (around 04/23/2024).  Marci Setter, MD, FACOG Obstetrician & Gynecologist, [facility] for [facility], [facility]

## 2024-03-27 ENCOUNTER — Ambulatory Visit: Payer: Self-pay | Admitting: Obstetrics and Gynecology

## 2024-03-27 DIAGNOSIS — O0990 Supervision of high risk pregnancy, unspecified, unspecified trimester: Secondary | ICD-10-CM

## 2024-03-27 DIAGNOSIS — B3731 Acute candidiasis of vulva and vagina: Secondary | ICD-10-CM

## 2024-03-27 LAB — CYTOLOGY - PAP
Chlamydia: NEGATIVE
Comment: NEGATIVE
Comment: NEGATIVE
Comment: NEGATIVE
Comment: NORMAL
Diagnosis: NEGATIVE
High risk HPV: NEGATIVE
Neisseria Gonorrhea: NEGATIVE
Trichomonas: POSITIVE — AB

## 2024-03-27 LAB — HEMOGLOBIN A1C
Est. average glucose Bld gHb Est-mCnc: 123 mg/dL
Hgb A1c MFr Bld: 5.9 % — ABNORMAL HIGH (ref 4.8–5.6)

## 2024-03-27 NOTE — Telephone Encounter (Signed)
-----   Message from Marci Setter sent at 03/27/2024  8:06 AM EDT ----- A1c elevated. She needs early 2 hr GTT. Also, her BP was a bit elevated on her last visit. Please re-iterate babyscripts, send baseline preeclampsia labs with her 2 hr GTT. Bring her in for BP review  in 2 weeks, may need to start medications. Thanks. ----- Message ----- From: Garner Jury Lab Results In Sent: 03/27/2024   5:36 AM EDT To: Marci Setter, MD

## 2024-03-27 NOTE — Telephone Encounter (Signed)
 Patient made aware of lab results and recommendation by Dr. Alto Atta.

## 2024-03-28 ENCOUNTER — Other Ambulatory Visit: Payer: Self-pay | Admitting: Obstetrics and Gynecology

## 2024-03-28 DIAGNOSIS — A5901 Trichomonal vulvovaginitis: Secondary | ICD-10-CM

## 2024-03-28 MED ORDER — MICONAZOLE NITRATE 2 % VA CREA: 1.0000 | TOPICAL_CREAM | Freq: Every day | VAGINAL | 0 refills | Status: DC

## 2024-03-28 MED ORDER — METRONIDAZOLE 500 MG PO TABS: 500.0000 mg | ORAL_TABLET | Freq: Two times a day (BID) | ORAL | 0 refills | Status: AC

## 2024-03-28 NOTE — Addendum Note (Signed)
 Addended by: Channa Hazelett T on: 03/28/2024 10:52 AM   Modules accepted: Orders

## 2024-03-30 NOTE — Telephone Encounter (Signed)
-----   Message from Marci Setter sent at 03/28/2024 10:52 AM EDT ----- +trich, rx sent. Also +yeast, rx sent. Please review with patient and recommend partner treatment for trich, etc. Will retest later in her pregnancy ----- Message ----- From: Interface, [facility] Lab Results In Sent: 03/27/2024   5:36 AM EDT To: Marci Setter, MD

## 2024-03-30 NOTE — Telephone Encounter (Signed)
 Verified with patient that she is aware of culture results.  She is and has obtained her Rx for treatment.  Instructed patient that partner must be treated and will retest later on in pregnancy.  Carly Murray

## 2024-04-02 LAB — PANORAMA PRENATAL TEST FULL PANEL:PANORAMA TEST PLUS 5 ADDITIONAL MICRODELETIONS: FETAL FRACTION: 4.6

## 2024-04-07 LAB — HORIZON CUSTOM: REPORT SUMMARY: NEGATIVE

## 2024-04-23 ENCOUNTER — Ambulatory Visit: Admitting: Family Medicine

## 2024-04-23 VITALS — BP 132/69 | HR 76 | Wt 178.1 lb

## 2024-04-23 DIAGNOSIS — O2341 Unspecified infection of urinary tract in pregnancy, first trimester: Secondary | ICD-10-CM | POA: Diagnosis not present

## 2024-04-23 DIAGNOSIS — G5600 Carpal tunnel syndrome, unspecified upper limb: Secondary | ICD-10-CM

## 2024-04-23 DIAGNOSIS — R7303 Prediabetes: Secondary | ICD-10-CM

## 2024-04-23 DIAGNOSIS — O219 Vomiting of pregnancy, unspecified: Secondary | ICD-10-CM | POA: Diagnosis not present

## 2024-04-23 DIAGNOSIS — Z3A17 17 weeks gestation of pregnancy: Secondary | ICD-10-CM | POA: Diagnosis not present

## 2024-04-23 DIAGNOSIS — O0990 Supervision of high risk pregnancy, unspecified, unspecified trimester: Secondary | ICD-10-CM

## 2024-04-23 DIAGNOSIS — O99352 Diseases of the nervous system complicating pregnancy, second trimester: Secondary | ICD-10-CM

## 2024-04-23 DIAGNOSIS — A5901 Trichomonal vulvovaginitis: Secondary | ICD-10-CM

## 2024-04-23 DIAGNOSIS — O09529 Supervision of elderly multigravida, unspecified trimester: Secondary | ICD-10-CM

## 2024-04-23 DIAGNOSIS — Z6841 Body Mass Index (BMI) 40.0 and over, adult: Secondary | ICD-10-CM

## 2024-04-23 MED ORDER — ONDANSETRON 4 MG PO TBDP: 4.0000 mg | ORAL_TABLET | Freq: Four times a day (QID) | ORAL | 3 refills | Status: DC | PRN

## 2024-04-23 NOTE — Progress Notes (Signed)
   PRENATAL VISIT NOTE  Subjective:  Carly Murray is a 38 y.o. G2P0101 at [redacted]w[redacted]d being seen today for ongoing prenatal care.  She is currently monitored for the following issues for this high-risk pregnancy and has Supervision of high risk pregnancy, antepartum; UTI in pregnancy; Obesity in pregnancy; and Antepartum multigravida of advanced maternal age on their problem list.  Patient reports carpal tunnel symptoms.  Contractions: Not present. Vag. Bleeding: None.  Movement: Present. Denies leaking of fluid.   The following portions of the patient's history were reviewed and updated as appropriate: allergies, current medications, past family history, past medical history, past social history, past surgical history and problem list.   Objective:    Vitals:   04/23/24 1116 04/23/24 1124  BP: (!) 152/91 132/69  Pulse: 79 76  Weight: 178 lb 1.9 oz (80.8 kg)     Fetal Status:  Fetal Heart Rate (bpm): 150   Movement: Present    General: Alert, oriented and cooperative. Patient is in no acute distress.  Skin: Skin is warm and dry. No rash noted.   Cardiovascular: Normal heart rate noted  Respiratory: Normal respiratory effort, no problems with respiration noted  Abdomen: Soft, gravid, appropriate for gestational age.  Pain/Pressure: Absent     Pelvic: Cervical exam deferred        Extremities: Normal range of motion.  Edema: None  Mental Status: Normal mood and affect. Normal behavior. Normal judgment and thought content.   Assessment and Plan:  Pregnancy: G2P0101 at [redacted]w[redacted]d 1. [redacted] weeks gestation of pregnancy - ondansetron  (ZOFRAN -ODT) 4 MG disintegrating tablet; Take 1 tablet (4 mg total) by mouth every 6 (six) hours as needed for nausea.  Dispense: 20 tablet; Refill: 3  2. Pregnancy related nausea and vomiting, antepartum (Primary) Improved on zofran  - ondansetron  (ZOFRAN -ODT) 4 MG disintegrating tablet; Take 1 tablet (4 mg total) by mouth every 6 (six) hours as needed for nausea.   Dispense: 20 tablet; Refill: 3  3. Supervision of high risk pregnancy, antepartum FHT normal Borderline BP On ASA 81mg   4. Urinary tract infection in mother during first trimester of pregnancy Needs test of cure - will get when she comes in for early 2hr GTT  5. Antepartum multigravida of advanced maternal age On ASA 81mg   6. Trichomonas vaginitis TOC in 2 weeks  7. BMI 45.0-49.9, adult (HCC)  8. Prediabetes Early 2hr GTT  Preterm labor symptoms and general obstetric precautions including but not limited to vaginal bleeding, contractions, leaking of fluid and fetal movement were reviewed in detail with the patient. Please refer to After Visit Summary for other counseling recommendations.   No follow-ups on file.  Future Appointments  Date Time Provider Department Center  05/04/2024  8:15 AM [facility] LAB [facility] None  05/14/2024 10:00 AM [facility] PROVIDER 1 [facility]  05/14/2024 10:30 AM [facility] US3 [facility]  05/21/2024  9:35 AM Barbra Lang PARAS, DO [facility] None  07/02/2024  8:15 AM Eveline Lynwood MATSU, MD [facility] None    Orabelle Rylee J Mykenzie Ebanks, DO

## 2024-04-29 ENCOUNTER — Encounter: Payer: Self-pay | Admitting: Sports Medicine

## 2024-04-29 ENCOUNTER — Ambulatory Visit (INDEPENDENT_AMBULATORY_CARE_PROVIDER_SITE_OTHER): Admitting: Sports Medicine

## 2024-04-29 VITALS — BP 130/80 | Ht 62.0 in | Wt 275.0 lb

## 2024-04-29 DIAGNOSIS — G5603 Carpal tunnel syndrome, bilateral upper limbs: Secondary | ICD-10-CM

## 2024-04-29 NOTE — Progress Notes (Signed)
   Subjective:    Patient ID: Carly Murray, female    DOB: 01/23/1986, 37 y.o.   MRN: [id number]  HPI chief complaint: Bilateral hand tingling and numbness  Patient is a very pleasant 38 year old female that is currently pregnant in her second trimester the presents today complaining of numbness and tingling in both hands, right greater than left.  She has had carpal tunnel in the past.  She has not had any specific treatment for it.  She describes intermittent numbness and tingling that begins in the wrist and will radiate into all fingers.  She has not noticed any weakness.  Past medical history reviewed Medications reviewed Allergies reviewed   Review of Systems As above    Objective:   Physical Exam   Well-developed, well-nourished.  No acute distress  Examination of both hands show full range of motion.  No effusion.  No soft tissue swelling.  Negative Tinel's over the carpal tunnel.  Negative Phalen's.  No atrophy.  Good strength.  Good pulses.     Assessment & Plan:   Bilateral carpal tunnel syndrome  Although her physical exam is unremarkable today, her history does suggest bilateral carpal tunnel syndrome.  We will trial a cock up wrist brace for the more symptomatic right wrist with instructions to wear it only at night.  If she finds this to be beneficial she may purchase a brace for the left wrist as well.  I explained that she will likely experience symptoms from carpal tunnel syndrome to some degree until she delivers.  She will follow-up for ongoing or recalcitrant issues.  This note was dictated using Dragon naturally speaking software and may contain errors in syntax, spelling, or content which have not been identified prior to signing this note.

## 2024-05-04 ENCOUNTER — Other Ambulatory Visit (INDEPENDENT_AMBULATORY_CARE_PROVIDER_SITE_OTHER)

## 2024-05-04 ENCOUNTER — Other Ambulatory Visit (HOSPITAL_COMMUNITY)
Admission: RE | Admit: 2024-05-04 | Discharge: 2024-05-04 | Disposition: A | Discharge status: Home / Self Care | Source: Ambulatory Visit | Attending: Obstetrics and Gynecology | Admitting: Obstetrics and Gynecology

## 2024-05-04 VITALS — BP 138/88 | HR 84 | Ht 62.0 in | Wt 281.0 lb

## 2024-05-04 DIAGNOSIS — A5901 Trichomonal vulvovaginitis: Secondary | ICD-10-CM

## 2024-05-04 DIAGNOSIS — O0990 Supervision of high risk pregnancy, unspecified, unspecified trimester: Secondary | ICD-10-CM

## 2024-05-04 NOTE — Progress Notes (Signed)
 Patient here to pick up Rx for 28 week labs and TOC for urine culture and Tric.  Cultures obtained.  Carly Murray

## 2024-05-05 ENCOUNTER — Ambulatory Visit: Payer: Self-pay | Admitting: Obstetrics and Gynecology

## 2024-05-05 LAB — CBC
Hematocrit: 42.7 % (ref 34.0–46.6)
Hemoglobin: 13.4 g/dL (ref 11.1–15.9)
MCH: 29.4 pg (ref 26.6–33.0)
MCHC: 31.4 g/dL — ABNORMAL LOW (ref 31.5–35.7)
MCV: 94 fL (ref 79–97)
Platelets: 359 x10E3/uL (ref 150–450)
RBC: 4.56 x10E6/uL (ref 3.77–5.28)
RDW: 12.9 % (ref 11.7–15.4)
WBC: 13.8 x10E3/uL — ABNORMAL HIGH (ref 3.4–10.8)

## 2024-05-05 LAB — CERVICOVAGINAL ANCILLARY ONLY
Bacterial Vaginitis (gardnerella): NEGATIVE
Candida Glabrata: NEGATIVE
Candida Vaginitis: NEGATIVE
Chlamydia: NEGATIVE
Comment: NEGATIVE
Comment: NEGATIVE
Comment: NEGATIVE
Comment: NEGATIVE
Comment: NEGATIVE
Comment: NORMAL
Neisseria Gonorrhea: NEGATIVE
Trichomonas: NEGATIVE

## 2024-05-05 LAB — GLUCOSE TOLERANCE, 2 HOURS W/ 1HR
Glucose, 1 hour: 161 mg/dL (ref 70–179)
Glucose, 2 hour: 140 mg/dL (ref 70–152)
Glucose, Fasting: 86 mg/dL (ref 70–91)

## 2024-05-05 LAB — HIV ANTIBODY (ROUTINE TESTING W REFLEX): HIV Screen 4th Generation wRfx: NONREACTIVE

## 2024-05-05 LAB — RPR: RPR Ser Ql: NONREACTIVE

## 2024-05-06 LAB — CULTURE, OB URINE

## 2024-05-06 LAB — URINE CULTURE, OB REFLEX

## 2024-05-08 ENCOUNTER — Encounter: Payer: Self-pay | Admitting: Obstetrics and Gynecology

## 2024-05-14 ENCOUNTER — Other Ambulatory Visit: Payer: Self-pay | Admitting: *Deleted

## 2024-05-14 ENCOUNTER — Ambulatory Visit (HOSPITAL_BASED_OUTPATIENT_CLINIC_OR_DEPARTMENT_OTHER)

## 2024-05-14 ENCOUNTER — Ambulatory Visit: Attending: Obstetrics and Gynecology | Admitting: Obstetrics and Gynecology

## 2024-05-14 VITALS — BP 137/65 | HR 76

## 2024-05-14 DIAGNOSIS — O99212 Obesity complicating pregnancy, second trimester: Secondary | ICD-10-CM | POA: Diagnosis not present

## 2024-05-14 DIAGNOSIS — O09522 Supervision of elderly multigravida, second trimester: Secondary | ICD-10-CM | POA: Diagnosis not present

## 2024-05-14 DIAGNOSIS — Z3A20 20 weeks gestation of pregnancy: Secondary | ICD-10-CM | POA: Insufficient documentation

## 2024-05-14 DIAGNOSIS — Z6841 Body Mass Index (BMI) 40.0 and over, adult: Secondary | ICD-10-CM | POA: Diagnosis present

## 2024-05-14 DIAGNOSIS — O0990 Supervision of high risk pregnancy, unspecified, unspecified trimester: Secondary | ICD-10-CM

## 2024-05-14 DIAGNOSIS — O09523 Supervision of elderly multigravida, third trimester: Secondary | ICD-10-CM | POA: Diagnosis not present

## 2024-05-14 DIAGNOSIS — O09892 Supervision of other high risk pregnancies, second trimester: Secondary | ICD-10-CM

## 2024-05-14 DIAGNOSIS — O09212 Supervision of pregnancy with history of pre-term labor, second trimester: Secondary | ICD-10-CM | POA: Insufficient documentation

## 2024-05-14 DIAGNOSIS — O99210 Obesity complicating pregnancy, unspecified trimester: Secondary | ICD-10-CM

## 2024-05-14 DIAGNOSIS — O0991 Supervision of high risk pregnancy, unspecified, first trimester: Secondary | ICD-10-CM

## 2024-05-14 DIAGNOSIS — Z3401 Encounter for supervision of normal first pregnancy, first trimester: Secondary | ICD-10-CM

## 2024-05-14 DIAGNOSIS — O99213 Obesity complicating pregnancy, third trimester: Secondary | ICD-10-CM

## 2024-05-14 DIAGNOSIS — E66813 Obesity, class 3: Secondary | ICD-10-CM | POA: Diagnosis not present

## 2024-05-14 DIAGNOSIS — E669 Obesity, unspecified: Secondary | ICD-10-CM | POA: Diagnosis not present

## 2024-05-14 DIAGNOSIS — O09529 Supervision of elderly multigravida, unspecified trimester: Secondary | ICD-10-CM | POA: Diagnosis present

## 2024-05-14 DIAGNOSIS — O0992 Supervision of high risk pregnancy, unspecified, second trimester: Secondary | ICD-10-CM | POA: Insufficient documentation

## 2024-05-14 DIAGNOSIS — Z362 Encounter for other antenatal screening follow-up: Secondary | ICD-10-CM

## 2024-05-14 DIAGNOSIS — O09292 Supervision of pregnancy with other poor reproductive or obstetric history, second trimester: Secondary | ICD-10-CM

## 2024-05-14 DIAGNOSIS — O09899 Supervision of other high risk pregnancies, unspecified trimester: Secondary | ICD-10-CM

## 2024-05-14 NOTE — Progress Notes (Signed)
 Maternal-Fetal Medicine Consultation Name: Carly Murray MRN: [id number]  G2 P0101 at 20w 2d gestation.  Patient is here for fetal anatomy scan.  She was accompanied by her mother.  Her high-risk problems include: - Advanced maternal age.  On cell-free fetal DNA screening, the risks of fetal aneuploidies are not increased. - Pregravid BMI 49. - History of preterm delivery.  Patient had a spontaneous preterm vaginal delivery 16 years ago.  Her son is in good health.  Ultrasound We performed a fetal anatomical survey.  Fetal biometry is consistent with the previously established dates.  Amniotic fluid is normal good fetal activity seen.  No markers of aneuploidies or obvious fetal structural defects are seen.  Fetal anatomical survey could not be completed because of fetal position. On transabdominal scan, the cervix measures 3.6 cm, which is normal.  Patient opted not to have transvaginal ultrasound today. Patient understands the limitations of ultrasound in detecting fetal anomalies.  History of preterm delivery This is a single most important cause of recurrent preterm delivery.  Previously, we used to recommend progesterone injections as prophylaxis to prevent preterm delivery.  However, following FDA disapproval, we stopped recommending progesterone.  In some cases, vaginal progesterone is reasonable if cervical shortening is seen (or less than 2.5 cm). Patient opted not to have transvaginal ultrasound today.  I counseled the patient that transvaginal ultrasound is far superior to transabdominal ultrasound in evaluating cervical length.  She does not have signs and symptoms of pelvic pressure or vaginal bleeding.  Pregravid BMI 49 Grade 3 obesity is independently associated with increased risk of stillbirth (2.5- to 3-fold), but the absolute risk is very small.  I discussed protocol of weekly antenatal testing from [redacted] weeks gestation until delivery. In addition, there are limitations in the  resolution of ultrasound images and fetal anomalies may be missed.  Weight loss is not advised in pregnancy and a weight gain of 11 to 20 pounds is reasonable. Patient takes low-dose aspirin  prophylaxis.  Recommendations -An appointment was made for her to return in 2 weeks for transvaginal ultrasound to evaluate the cervix and in 4 weeks for completion of fetal anatomy. - Fetal growth assessments every 4 weeks till delivery. - Weekly antenatal testing from [redacted] weeks gestation until delivery.    Consultation including face-to-face (more than 50%) counseling 30 minutes.

## 2024-05-21 ENCOUNTER — Ambulatory Visit: Admitting: Family Medicine

## 2024-05-21 VITALS — BP 127/50 | HR 83 | Wt 284.0 lb

## 2024-05-21 DIAGNOSIS — O09529 Supervision of elderly multigravida, unspecified trimester: Secondary | ICD-10-CM | POA: Diagnosis not present

## 2024-05-21 DIAGNOSIS — O0990 Supervision of high risk pregnancy, unspecified, unspecified trimester: Secondary | ICD-10-CM

## 2024-05-21 DIAGNOSIS — Z3A21 21 weeks gestation of pregnancy: Secondary | ICD-10-CM | POA: Diagnosis not present

## 2024-05-21 NOTE — Progress Notes (Signed)
   PRENATAL VISIT NOTE  Subjective:  Carly Murray is a 38 y.o. G2P0101 at [redacted]w[redacted]d being seen today for ongoing prenatal care.  She is currently monitored for the following issues for this low-risk pregnancy and has Supervision of high risk pregnancy, antepartum; UTI in pregnancy; Obesity in pregnancy; Antepartum multigravida of advanced maternal age; and Prediabetes on their problem list.  Patient reports no complaints.  Contractions: Not present. Vag. Bleeding: None.  Movement: Present. Denies leaking of fluid.   The following portions of the patient's history were reviewed and updated as appropriate: allergies, current medications, past family history, past medical history, past social history, past surgical history and problem list.   Objective:    Vitals:   05/21/24 0941  BP: (!) 127/50  Pulse: 83  Weight: 284 lb 0.6 oz (128.8 kg)    Fetal Status:      Movement: Present    General: Alert, oriented and cooperative. Patient is in no acute distress.  Skin: Skin is warm and dry. No rash noted.   Cardiovascular: Normal heart rate noted  Respiratory: Normal respiratory effort, no problems with respiration noted  Abdomen: Soft, gravid, appropriate for gestational age.  Pain/Pressure: Absent     Pelvic: Cervical exam deferred        Extremities: Normal range of motion.  Edema: None  Mental Status: Normal mood and affect. Normal behavior. Normal judgment and thought content.   Assessment and Plan:  Pregnancy: G2P0101 at [redacted]w[redacted]d 1. [redacted] weeks gestation of pregnancy (Primary) - AFP, Serum, Open Spina Bifida  2. Supervision of high risk pregnancy, antepartum FHR normal Early 2hr GTT normal TOC neg  3. Antepartum multigravida of advanced maternal age ASA 81mg   Preterm labor symptoms and general obstetric precautions including but not limited to vaginal bleeding, contractions, leaking of fluid and fetal movement were reviewed in detail with the patient. Please refer to After Visit  Summary for other counseling recommendations.   No follow-ups on file.  Future Appointments  Date Time Provider Department Center  05/28/2024  7:15 AM [facility] PROVIDER 1 [facility]  05/28/2024  7:30 AM [facility] US5 [facility]  06/11/2024  7:15 AM [facility] PROVIDER 1 [facility]  06/11/2024  7:30 AM [facility] US5 [facility]  07/02/2024  8:15 AM Eveline Lynwood MATSU, MD [facility] None  07/09/2024  7:15 AM [facility] PROVIDER 1 [facility]  07/09/2024  7:30 AM [facility] US3 [facility]    Lang JINNY Peel, DO

## 2024-05-28 ENCOUNTER — Ambulatory Visit: Attending: Obstetrics and Gynecology

## 2024-05-28 ENCOUNTER — Other Ambulatory Visit: Payer: Self-pay

## 2024-05-28 ENCOUNTER — Other Ambulatory Visit: Payer: Self-pay | Admitting: Obstetrics and Gynecology

## 2024-05-28 ENCOUNTER — Ambulatory Visit (HOSPITAL_BASED_OUTPATIENT_CLINIC_OR_DEPARTMENT_OTHER): Admitting: Obstetrics and Gynecology

## 2024-05-28 VITALS — BP 141/82 | HR 91

## 2024-05-28 DIAGNOSIS — O09212 Supervision of pregnancy with history of pre-term labor, second trimester: Secondary | ICD-10-CM | POA: Diagnosis not present

## 2024-05-28 DIAGNOSIS — O09529 Supervision of elderly multigravida, unspecified trimester: Secondary | ICD-10-CM

## 2024-05-28 DIAGNOSIS — O99212 Obesity complicating pregnancy, second trimester: Secondary | ICD-10-CM | POA: Diagnosis not present

## 2024-05-28 DIAGNOSIS — Z3A22 22 weeks gestation of pregnancy: Secondary | ICD-10-CM | POA: Insufficient documentation

## 2024-05-28 DIAGNOSIS — Z362 Encounter for other antenatal screening follow-up: Secondary | ICD-10-CM | POA: Insufficient documentation

## 2024-05-28 DIAGNOSIS — O99210 Obesity complicating pregnancy, unspecified trimester: Secondary | ICD-10-CM | POA: Diagnosis present

## 2024-05-28 DIAGNOSIS — O0990 Supervision of high risk pregnancy, unspecified, unspecified trimester: Secondary | ICD-10-CM | POA: Diagnosis present

## 2024-05-28 DIAGNOSIS — E669 Obesity, unspecified: Secondary | ICD-10-CM | POA: Diagnosis not present

## 2024-05-28 DIAGNOSIS — O09899 Supervision of other high risk pregnancies, unspecified trimester: Secondary | ICD-10-CM

## 2024-05-28 NOTE — Progress Notes (Signed)
 After review, MFM consult with provider is not indicated for today  Arna Ranks, MD 05/28/2024 10:08 AM  [facility]

## 2024-06-01 ENCOUNTER — Inpatient Hospital Stay (HOSPITAL_COMMUNITY)
Admission: AD | Admit: 2024-06-01 | Discharge: 2024-06-01 | Disposition: A | Discharge status: Home / Self Care | Attending: Obstetrics and Gynecology | Admitting: Obstetrics and Gynecology

## 2024-06-01 ENCOUNTER — Encounter: Payer: Self-pay | Admitting: Family Medicine

## 2024-06-01 DIAGNOSIS — R197 Diarrhea, unspecified: Secondary | ICD-10-CM | POA: Diagnosis present

## 2024-06-01 DIAGNOSIS — E860 Dehydration: Secondary | ICD-10-CM | POA: Diagnosis not present

## 2024-06-01 DIAGNOSIS — Z3A22 22 weeks gestation of pregnancy: Secondary | ICD-10-CM | POA: Diagnosis not present

## 2024-06-01 DIAGNOSIS — O99282 Endocrine, nutritional and metabolic diseases complicating pregnancy, second trimester: Secondary | ICD-10-CM | POA: Diagnosis not present

## 2024-06-01 DIAGNOSIS — O99891 Other specified diseases and conditions complicating pregnancy: Secondary | ICD-10-CM

## 2024-06-01 LAB — URINALYSIS, ROUTINE W REFLEX MICROSCOPIC
Bilirubin Urine: NEGATIVE
Glucose, UA: NEGATIVE mg/dL
Ketones, ur: NEGATIVE mg/dL
Leukocytes,Ua: NEGATIVE
Nitrite: NEGATIVE
Protein, ur: NEGATIVE mg/dL
Specific Gravity, Urine: 1.031 — ABNORMAL HIGH (ref 1.005–1.030)
pH: 5 (ref 5.0–8.0)

## 2024-06-01 LAB — BASIC METABOLIC PANEL WITH GFR
Anion gap: 9 (ref 5–15)
BUN: 7 mg/dL (ref 6–20)
CO2: 20 mmol/L — ABNORMAL LOW (ref 22–32)
Calcium: 9.3 mg/dL (ref 8.9–10.3)
Chloride: 106 mmol/L (ref 98–111)
Creatinine, Ser: 0.63 mg/dL (ref 0.44–1.00)
GFR, Estimated: >60 mL/min (ref >60)
Glucose, Bld: 114 mg/dL — ABNORMAL HIGH (ref 70–99)
Potassium: 3.8 mmol/L (ref 3.5–5.1)
Sodium: 135 mmol/L (ref 135–145)

## 2024-06-01 NOTE — MAU Provider Note (Signed)
  History     CSN: [id number]  Arrival date and time: 06/01/24 1724   Event Date/Time   First Provider Initiated Contact with Patient 06/01/24 1845      Chief Complaint  Patient presents with   Diarrhea   food poisoning   Patient is a 38 y/o G2P0101 at [redacted]w[redacted]d who presents to MAU for concern for dehydration. She and co-workers ate at a food truck on Friday and they all got gastroenteritis symptoms. Patient got mild diarrhea symptoms on Friday that resolved over the weekend. Able to tolerate PO intake all weekend. Diarrhea has resolved. Denies fever, chills, chest pain, SOB, urinary symptoms. Denies contractions/cramping, VB, LOF. Patient was advised to come to MAU to rule out dehydration.   Past Medical History:  Diagnosis Date   Obesity    Recurrent infection of skin     Past Surgical History:  Procedure Laterality Date   KNEE SURGERY      Family History  Problem Relation Age of Onset   Diabetes Mother    COPD Mother    Hypertension Mother    Lupus Mother    Hepatitis C Father     Social History   Tobacco Use   Smoking status: Never   Smokeless tobacco: Never  Vaping Use   Vaping status: Never Used  Substance Use Topics   Alcohol use: Not Currently    Comment: occ   Drug use: Never    Allergies: No Known Allergies  Medications Prior to Admission  Medication Sig Dispense Refill Last Dose/Taking   aspirin  EC 81 MG tablet Take 1 tablet (81 mg total) by mouth at bedtime. Start taking when you are [redacted] weeks pregnant for rest of pregnancy for prevention of preeclampsia 300 tablet 2    miconazole  (MONISTAT  7) 2 % vaginal cream Place 1 Applicatorful vaginally at bedtime. (Patient not taking: Reported on 05/21/2024) 45 g 0    ondansetron  (ZOFRAN -ODT) 4 MG disintegrating tablet Take 1 tablet (4 mg total) by mouth every 6 (six) hours as needed for nausea. (Patient not taking: Reported on 05/21/2024) 20 tablet 3    Prenatal Vit-Fe Fumarate-FA (PRENATAL VITAMINS PO) Take 1  capsule by mouth daily.       ROS negative aside from what is listed in the HPI.  Physical Exam   Blood pressure (!) 158/92, pulse 89, temperature 98.7 F (37.1 C), temperature source Oral, resp. rate 18, height 5' 2 (1.575 m), weight 129.3 kg, last menstrual period 12/17/2023, SpO2 100%.  General: Alert, cooperative, in no acute distress HEENT: NCAT, anicteric Neck: no visible lymphadenopathy or goiter Pulm: no respiratory distress Cardiac: extremities appear pink and well-perfused Abdomen: non-distended Extremities: no peripheral edema Skin: surgical incisions covered, dressings intact   MAU Course   MDM  #Dehydration #[redacted] weeks gestation  Patient presents to rule out dehydration after mild episode of diarrhea on Friday 05/29/24 after eating from a food truck. All symptoms resolved.  Assessment and Plan   #Dehydration #[redacted] weeks gestation  -U/A shows possibility of mild dehydration with increased specific gravity. No evidence of decreased renal function on BMP. -Patient currently denies nausea, vomiting, diarrhea and is otherwise asymptomatic. -Elevated BP on triage which resolved on repeat.   -Safe to discharge home -Encouraged PO intake.  -All questions answered, anticipatory guidance and detailed return precautions provided  Yosselyn Tax L Sricharan Lacomb 06/01/2024, 6:51 PM

## 2024-06-01 NOTE — MAU Note (Signed)
 Carly Murray is a 38 y.o. at [redacted]w[redacted]d here in MAU reporting: when she got home on Friday, started having diarrhea.  Found out several co-workers also.  Continued over the weekends.  Hasn't thrown up. 6 stools today, somewhat formed. Cramping off and on. Denies vag bleeding. No fever.   Onset of complaint: Friday.  Pain score: mild Vitals:   06/01/24 1847  BP: (!) 158/92  Pulse: 89  Resp: 18  Temp: 98.7 F (37.1 C)  SpO2: 100%     FHT:154 Lab orders placed from triage:  blood work and urine are back   BP recheck

## 2024-06-11 ENCOUNTER — Ambulatory Visit: Attending: Obstetrics and Gynecology

## 2024-06-11 ENCOUNTER — Ambulatory Visit (HOSPITAL_BASED_OUTPATIENT_CLINIC_OR_DEPARTMENT_OTHER): Admitting: Obstetrics and Gynecology

## 2024-06-11 VITALS — BP 125/78 | HR 91

## 2024-06-11 DIAGNOSIS — O09899 Supervision of other high risk pregnancies, unspecified trimester: Secondary | ICD-10-CM | POA: Insufficient documentation

## 2024-06-11 DIAGNOSIS — O09529 Supervision of elderly multigravida, unspecified trimester: Secondary | ICD-10-CM | POA: Insufficient documentation

## 2024-06-11 DIAGNOSIS — O09212 Supervision of pregnancy with history of pre-term labor, second trimester: Secondary | ICD-10-CM | POA: Diagnosis not present

## 2024-06-11 DIAGNOSIS — E669 Obesity, unspecified: Secondary | ICD-10-CM

## 2024-06-11 DIAGNOSIS — O99210 Obesity complicating pregnancy, unspecified trimester: Secondary | ICD-10-CM | POA: Diagnosis present

## 2024-06-11 DIAGNOSIS — Z3A24 24 weeks gestation of pregnancy: Secondary | ICD-10-CM

## 2024-06-11 DIAGNOSIS — O99212 Obesity complicating pregnancy, second trimester: Secondary | ICD-10-CM | POA: Diagnosis not present

## 2024-06-11 DIAGNOSIS — Z362 Encounter for other antenatal screening follow-up: Secondary | ICD-10-CM | POA: Insufficient documentation

## 2024-06-11 NOTE — Progress Notes (Signed)
 After review, MFM consult with provider is not indicated for today  Arna Ranks, MD 06/11/2024 8:16 AM  [facility]

## 2024-07-02 ENCOUNTER — Ambulatory Visit: Admitting: Obstetrics & Gynecology

## 2024-07-02 VITALS — BP 150/72 | HR 94 | Wt 289.1 lb

## 2024-07-02 DIAGNOSIS — O0990 Supervision of high risk pregnancy, unspecified, unspecified trimester: Secondary | ICD-10-CM | POA: Diagnosis not present

## 2024-07-02 DIAGNOSIS — O99210 Obesity complicating pregnancy, unspecified trimester: Secondary | ICD-10-CM

## 2024-07-02 DIAGNOSIS — Z3A27 27 weeks gestation of pregnancy: Secondary | ICD-10-CM

## 2024-07-02 DIAGNOSIS — O163 Unspecified maternal hypertension, third trimester: Secondary | ICD-10-CM | POA: Diagnosis not present

## 2024-07-02 DIAGNOSIS — Z23 Encounter for immunization: Secondary | ICD-10-CM

## 2024-07-02 MED ORDER — NIFEDIPINE ER OSMOTIC RELEASE 30 MG PO TB24: 30.0000 mg | ORAL_TABLET | Freq: Every day | ORAL | 2 refills | Status: DC

## 2024-07-02 NOTE — Progress Notes (Signed)
   PRENATAL VISIT NOTE  Subjective:  Carly Murray is a 38 y.o. G2P0101 at [redacted]w[redacted]d being seen today for ongoing prenatal care.  She is currently monitored for the following issues for this high-risk pregnancy and has Supervision of high risk pregnancy, antepartum; UTI in pregnancy; Obesity in pregnancy; Antepartum multigravida of advanced maternal age; and Prediabetes on their problem list.  Patient reports no complaints.  Contractions: Not present. Vag. Bleeding: None.  Movement: Present. Denies leaking of fluid.   The following portions of the patient's history were reviewed and updated as appropriate: allergies, current medications, past family history, past medical history, past social history, past surgical history and problem list.   Objective:    Vitals:   07/02/24 0824 07/02/24 0829  BP: (!) 150/87 (!) 150/72  Pulse: 94 94  Weight: 289 lb 1.3 oz (131.1 kg)     Fetal Status:  Fetal Heart Rate (bpm): 142   Movement: Present    General: Alert, oriented and cooperative. Patient is in no acute distress.  Skin: Skin is warm and dry. No rash noted.   Cardiovascular: Normal heart rate noted  Respiratory: Normal respiratory effort, no problems with respiration noted  Abdomen: Soft, gravid, appropriate for gestational age.  Pain/Pressure: Absent     Pelvic: Cervical exam deferred        Extremities: Normal range of motion.  Edema: Trace  Mental Status: Normal mood and affect. Normal behavior. Normal judgment and thought content.   Assessment and Plan:  Pregnancy: G2P0101 at [redacted]w[redacted]d 1. [redacted] weeks gestation of pregnancy (Primary) Needs labs - Tdap vaccine greater than or equal to 7yo IM  2. Supervision of high risk pregnancy, antepartum HTN in early pregnancy noted - NIFEdipine  (PROCARDIA -XL/NIFEDICAL-XL) 30 MG 24 hr tablet; Take 1 tablet (30 mg total) by mouth daily.  Dispense: 30 tablet; Refill: 2  3. Obesity in pregnancy   4. Hypertension affecting pregnancy in third  trimester   Preterm labor symptoms and general obstetric precautions including but not limited to vaginal bleeding, contractions, leaking of fluid and fetal movement were reviewed in detail with the patient. Please refer to After Visit Summary for other counseling recommendations.   Return in about 2 weeks (around 07/16/2024).  Future Appointments  Date Time Provider Department Center  07/09/2024  7:15 AM [facility] PROVIDER 1 [facility]  07/09/2024  7:30 AM [facility] US3 [facility]  07/16/2024  8:15 AM Barbra Lang PARAS, DO [facility] None  07/30/2024  8:35 AM Stinson, Jacob J, DO [facility] None  08/13/2024  8:35 AM Stinson, Jacob J, DO [facility] None  08/27/2024  8:15 AM Stinson, Jacob J, DO [facility] None  09/03/2024  8:15 AM Stinson, Jacob J, DO [facility] None  09/08/2024  8:15 AM Synthia Raisin, CNM [facility] None    Lynwood Solomons, MD

## 2024-07-03 ENCOUNTER — Other Ambulatory Visit (INDEPENDENT_AMBULATORY_CARE_PROVIDER_SITE_OTHER)

## 2024-07-03 VITALS — BP 138/80 | HR 90 | Ht 62.0 in | Wt 287.0 lb

## 2024-07-03 DIAGNOSIS — O0990 Supervision of high risk pregnancy, unspecified, unspecified trimester: Secondary | ICD-10-CM | POA: Diagnosis not present

## 2024-07-03 DIAGNOSIS — Z3A28 28 weeks gestation of pregnancy: Secondary | ICD-10-CM | POA: Diagnosis not present

## 2024-07-03 NOTE — Progress Notes (Signed)
 Patient here for 28 week labs and BP check.  BP was elevated in office yesterday.  Today 138/80.  Denies headache, blurred vision or dizziness.  States she has BP cuff at home and is monitoring daily.  Reviewed signs and symptoms of hypertension.  Instructed to call with any elevated BP's, headache, blurred vision, dizziness or swelling.  Patient agreed.  Erminio DELENA Rumps, RN

## 2024-07-04 LAB — CBC
Hematocrit: 38.6 % (ref 34.0–46.6)
Hemoglobin: 12.8 g/dL (ref 11.1–15.9)
MCH: 30.7 pg (ref 26.6–33.0)
MCHC: 33.2 g/dL (ref 31.5–35.7)
MCV: 93 fL (ref 79–97)
Platelets: 354 x10E3/uL (ref 150–450)
RBC: 4.17 x10E6/uL (ref 3.77–5.28)
RDW: 12.9 % (ref 11.7–15.4)
WBC: 14 x10E3/uL — ABNORMAL HIGH (ref 3.4–10.8)

## 2024-07-04 LAB — GLUCOSE TOLERANCE, 2 HOURS W/ 1HR
Glucose, 1 hour: 165 mg/dL (ref 70–179)
Glucose, 2 hour: 149 mg/dL (ref 70–152)
Glucose, Fasting: 83 mg/dL (ref 70–91)

## 2024-07-04 LAB — HIV ANTIBODY (ROUTINE TESTING W REFLEX): HIV Screen 4th Generation wRfx: NONREACTIVE

## 2024-07-04 LAB — RPR: RPR Ser Ql: NONREACTIVE

## 2024-07-06 ENCOUNTER — Ambulatory Visit: Payer: Self-pay | Admitting: Family Medicine

## 2024-07-06 DIAGNOSIS — O0990 Supervision of high risk pregnancy, unspecified, unspecified trimester: Secondary | ICD-10-CM

## 2024-07-09 ENCOUNTER — Ambulatory Visit: Attending: Obstetrics and Gynecology

## 2024-07-09 ENCOUNTER — Ambulatory Visit (HOSPITAL_BASED_OUTPATIENT_CLINIC_OR_DEPARTMENT_OTHER): Admitting: Obstetrics

## 2024-07-09 ENCOUNTER — Other Ambulatory Visit: Payer: Self-pay | Admitting: *Deleted

## 2024-07-09 VITALS — BP 142/65 | HR 96

## 2024-07-09 DIAGNOSIS — O09523 Supervision of elderly multigravida, third trimester: Secondary | ICD-10-CM | POA: Insufficient documentation

## 2024-07-09 DIAGNOSIS — O09513 Supervision of elderly primigravida, third trimester: Secondary | ICD-10-CM | POA: Diagnosis not present

## 2024-07-09 DIAGNOSIS — O09529 Supervision of elderly multigravida, unspecified trimester: Secondary | ICD-10-CM

## 2024-07-09 DIAGNOSIS — Z362 Encounter for other antenatal screening follow-up: Secondary | ICD-10-CM | POA: Diagnosis present

## 2024-07-09 DIAGNOSIS — Z3A28 28 weeks gestation of pregnancy: Secondary | ICD-10-CM | POA: Insufficient documentation

## 2024-07-09 DIAGNOSIS — E669 Obesity, unspecified: Secondary | ICD-10-CM

## 2024-07-09 DIAGNOSIS — O99210 Obesity complicating pregnancy, unspecified trimester: Secondary | ICD-10-CM

## 2024-07-09 DIAGNOSIS — O09213 Supervision of pregnancy with history of pre-term labor, third trimester: Secondary | ICD-10-CM | POA: Diagnosis not present

## 2024-07-09 DIAGNOSIS — O99213 Obesity complicating pregnancy, third trimester: Secondary | ICD-10-CM | POA: Diagnosis present

## 2024-07-09 DIAGNOSIS — O09899 Supervision of other high risk pregnancies, unspecified trimester: Secondary | ICD-10-CM | POA: Diagnosis present

## 2024-07-09 NOTE — Progress Notes (Signed)
 MFM Consult Note  Trinidee Schrag is currently at 28 weeks and 2 days.  She has been followed due to advanced maternal age (38 years old) and maternal obesity with a BMI of 49.  She denies any problems since her last exam and has screened negative for gestational diabetes in her current pregnancy.  Sonographic findings Single intrauterine pregnancy at 28w 2d.  Fetal cardiac activity:  Observed and appears normal. Presentation: Cephalic. Interval fetal anatomy appears normal. Fetal biometry shows the estimated fetal weight of 2 pounds 12 ounces which measures at the 47th percentile. Amniotic fluid volume: Within normal limits. MVP: 4.71 cm. Placenta: Anterior.  There are limitations of prenatal ultrasound such as the inability to detect certain abnormalities due to poor visualization. Various factors such as fetal position, gestational age and maternal body habitus may increase the difficulty in visualizing the fetal anatomy.    Due to maternal obesity, we will continue to follow her with monthly growth ultrasounds.    Weekly fetal testing will be started at around 34 weeks.    She will return in 6 weeks for a growth scan and BPP.  The patient stated that all of her questions were answered today.  A total of 20 minutes was spent counseling and coordinating the care for this patient.  Greater than 50% of the time was spent in direct face-to-face contact.

## 2024-07-16 ENCOUNTER — Ambulatory Visit (INDEPENDENT_AMBULATORY_CARE_PROVIDER_SITE_OTHER): Admitting: Family Medicine

## 2024-07-16 VITALS — BP 123/87 | HR 99 | Wt 290.0 lb

## 2024-07-16 DIAGNOSIS — O09899 Supervision of other high risk pregnancies, unspecified trimester: Secondary | ICD-10-CM | POA: Insufficient documentation

## 2024-07-16 DIAGNOSIS — O09529 Supervision of elderly multigravida, unspecified trimester: Secondary | ICD-10-CM | POA: Diagnosis not present

## 2024-07-16 DIAGNOSIS — O163 Unspecified maternal hypertension, third trimester: Secondary | ICD-10-CM | POA: Insufficient documentation

## 2024-07-16 DIAGNOSIS — O10919 Unspecified pre-existing hypertension complicating pregnancy, unspecified trimester: Secondary | ICD-10-CM | POA: Insufficient documentation

## 2024-07-16 DIAGNOSIS — R7303 Prediabetes: Secondary | ICD-10-CM

## 2024-07-16 DIAGNOSIS — O0990 Supervision of high risk pregnancy, unspecified, unspecified trimester: Secondary | ICD-10-CM | POA: Diagnosis not present

## 2024-07-16 DIAGNOSIS — Z6841 Body Mass Index (BMI) 40.0 and over, adult: Secondary | ICD-10-CM | POA: Insufficient documentation

## 2024-07-16 DIAGNOSIS — Z3A29 29 weeks gestation of pregnancy: Secondary | ICD-10-CM

## 2024-07-16 NOTE — Progress Notes (Signed)
   PRENATAL VISIT NOTE  Subjective:  Carly Murray is a 38 y.o. G2P0101 at [redacted]w[redacted]d being seen today for ongoing prenatal care.  She is currently monitored for the following issues for this high-risk pregnancy and has Supervision of high risk pregnancy, antepartum; UTI in pregnancy; Obesity in pregnancy; Antepartum multigravida of advanced maternal age; Prediabetes; Hypertension affecting pregnancy in third trimester; History of preterm delivery, currently pregnant; and BMI 45.0-49.9, adult (HCC) on their problem list.  Patient reports no complaints.  Contractions: Not present. Vag. Bleeding: None.  Movement: Present. Denies leaking of fluid.   The following portions of the patient's history were reviewed and updated as appropriate: allergies, current medications, past family history, past medical history, past social history, past surgical history and problem list.   Objective:    Vitals:   07/16/24 0823  BP: 123/87  Pulse: 99  Weight: 290 lb (131.5 kg)    Fetal Status:  Fetal Heart Rate (bpm): 146   Movement: Present    General: Alert, oriented and cooperative. Patient is in no acute distress.  Skin: Skin is warm and dry. No rash noted.   Cardiovascular: Normal heart rate noted  Respiratory: Normal respiratory effort, no problems with respiration noted  Abdomen: Soft, gravid, appropriate for gestational age.  Pain/Pressure: Present     Pelvic: Cervical exam deferred        Extremities: Normal range of motion.  Edema: Trace  Mental Status: Normal mood and affect. Normal behavior. Normal judgment and thought content.   Assessment and Plan:  Pregnancy: G2P0101 at [redacted]w[redacted]d 1. [redacted] weeks gestation of pregnancy (Primary)  2. Supervision of high risk pregnancy, antepartum FHT normal  3. Prediabetes Normal 2hr gtt  4. Antepartum multigravida of advanced maternal age ASA 81mg   5. BMI 45.0-49.9, adult (HCC)  6. Hypertension affecting pregnancy in third trimester EFW 47% BPP at 32  weeks On medicine Well controlled.  7. History of preterm delivery, currently pregnant Spontaneous at 29wiks  Preterm labor symptoms and general obstetric precautions including but not limited to vaginal bleeding, contractions, leaking of fluid and fetal movement were reviewed in detail with the patient. Please refer to After Visit Summary for other counseling recommendations.   No follow-ups on file.  Future Appointments  Date Time Provider Department Center  07/30/2024  8:35 AM Barbra Lang PARAS, DO [facility] None  08/13/2024  8:35 AM Masahiro Iglesia J, DO [facility] None  08/20/2024  2:45 PM [facility] PROVIDER 1 [facility]  08/20/2024  3:00 PM [facility] US2 [facility]  08/27/2024  8:15 AM Barbra Lang PARAS, DO [facility] None  08/27/2024  2:45 PM [facility] PROVIDER 1 [facility]  08/27/2024  3:00 PM [facility] US3 [facility]  09/03/2024  8:15 AM Barbra Lang PARAS, DO [facility] None  09/03/2024  2:45 PM [facility] PROVIDER 1 [facility]  09/03/2024  3:00 PM [facility] US3 [facility]  09/08/2024  8:15 AM Synthia Raisin, CNM [facility] None  09/08/2024  2:45 PM [facility] PROVIDER 1 [facility]  09/08/2024  3:00 PM [facility] US1 [facility]    Lang PARAS Barbra, DO

## 2024-07-30 ENCOUNTER — Ambulatory Visit (INDEPENDENT_AMBULATORY_CARE_PROVIDER_SITE_OTHER): Admitting: Family Medicine

## 2024-07-30 VITALS — BP 126/91 | HR 100 | Wt 288.0 lb

## 2024-07-30 DIAGNOSIS — Z6841 Body Mass Index (BMI) 40.0 and over, adult: Secondary | ICD-10-CM | POA: Diagnosis not present

## 2024-07-30 DIAGNOSIS — O0990 Supervision of high risk pregnancy, unspecified, unspecified trimester: Secondary | ICD-10-CM | POA: Diagnosis not present

## 2024-07-30 DIAGNOSIS — Z3A31 31 weeks gestation of pregnancy: Secondary | ICD-10-CM | POA: Diagnosis not present

## 2024-07-30 DIAGNOSIS — O09529 Supervision of elderly multigravida, unspecified trimester: Secondary | ICD-10-CM | POA: Diagnosis not present

## 2024-07-30 DIAGNOSIS — O09899 Supervision of other high risk pregnancies, unspecified trimester: Secondary | ICD-10-CM

## 2024-07-30 DIAGNOSIS — O163 Unspecified maternal hypertension, third trimester: Secondary | ICD-10-CM

## 2024-07-30 NOTE — Progress Notes (Signed)
   PRENATAL VISIT NOTE  Subjective:  Carly Murray is a 38 y.o. G2P0101 at [redacted]w[redacted]d being seen today for ongoing prenatal care.  She is currently monitored for the following issues for this high-risk pregnancy and has Supervision of high risk pregnancy, antepartum; UTI in pregnancy; Obesity in pregnancy; Antepartum multigravida of advanced maternal age; Prediabetes; Hypertension affecting pregnancy in third trimester; History of preterm delivery, currently pregnant; and BMI 45.0-49.9, adult (HCC) on their problem list.  Patient reports no complaints.  Contractions: Irritability. Vag. Bleeding: None.  Movement: Present. Denies leaking of fluid.   The following portions of the patient's history were reviewed and updated as appropriate: allergies, current medications, past family history, past medical history, past social history, past surgical history and problem list.   Objective:    Vitals:   07/30/24 0832  BP: (!) 126/91  Pulse: 100  Weight: 288 lb 0.6 oz (130.7 kg)    Fetal Status:  Fetal Heart Rate (bpm): 150   Movement: Present    General: Alert, oriented and cooperative. Patient is in no acute distress.  Skin: Skin is warm and dry. No rash noted.   Cardiovascular: Normal heart rate noted  Respiratory: Normal respiratory effort, no problems with respiration noted  Abdomen: Soft, gravid, appropriate for gestational age.  Pain/Pressure: Absent     Pelvic: Cervical exam deferred        Extremities: Normal range of motion.  Edema: Trace  Mental Status: Normal mood and affect. Normal behavior. Normal judgment and thought content.   Assessment and Plan:  Pregnancy: G2P0101 at [redacted]w[redacted]d 1. [redacted] weeks gestation of pregnancy (Primary)  2. Supervision of high risk pregnancy, antepartum FHT normal  3. Antepartum multigravida of advanced maternal age On ASA 81mg   4. BMI 45.0-49.9, adult (HCC)  5. History of preterm delivery, currently pregnant No signs of PTL  6. Hypertension  affecting pregnancy in third trimester Not taking BP meds. Diastolic a little elevated today. Will continue to watch. Continue Serial US  for fetal growth. Antenatal testing beginning of November Delivery at 38-39 weeks if stays stable.  Preterm labor symptoms and general obstetric precautions including but not limited to vaginal bleeding, contractions, leaking of fluid and fetal movement were reviewed in detail with the patient. Please refer to After Visit Summary for other counseling recommendations.   No follow-ups on file.  Future Appointments  Date Time Provider Department Center  08/13/2024  8:35 AM Barbra Lang PARAS, DO [facility] None  08/20/2024  2:45 PM [facility] PROVIDER 1 [facility]  08/20/2024  3:00 PM [facility] US2 [facility]  08/27/2024  8:15 AM Barbra Lang PARAS, DO [facility] None  08/27/2024  2:45 PM [facility] PROVIDER 1 [facility]  08/27/2024  3:00 PM [facility] US3 [facility]  09/03/2024  8:15 AM Barbra Lang PARAS, DO [facility] None  09/03/2024  2:45 PM [facility] PROVIDER 1 [facility]  09/03/2024  3:00 PM [facility] US3 [facility]  09/08/2024  8:15 AM Synthia Raisin, CNM [facility] None  09/08/2024  2:45 PM [facility] PROVIDER 1 [facility]  09/08/2024  3:00 PM [facility] US1 [facility]    Lang PARAS Barbra, DO

## 2024-08-13 ENCOUNTER — Ambulatory Visit (INDEPENDENT_AMBULATORY_CARE_PROVIDER_SITE_OTHER): Admitting: Family Medicine

## 2024-08-13 VITALS — BP 134/87 | HR 94 | Wt 295.0 lb

## 2024-08-13 DIAGNOSIS — Z6841 Body Mass Index (BMI) 40.0 and over, adult: Secondary | ICD-10-CM

## 2024-08-13 DIAGNOSIS — O0990 Supervision of high risk pregnancy, unspecified, unspecified trimester: Secondary | ICD-10-CM | POA: Diagnosis not present

## 2024-08-13 DIAGNOSIS — O163 Unspecified maternal hypertension, third trimester: Secondary | ICD-10-CM

## 2024-08-13 DIAGNOSIS — Z3A33 33 weeks gestation of pregnancy: Secondary | ICD-10-CM

## 2024-08-13 DIAGNOSIS — O09899 Supervision of other high risk pregnancies, unspecified trimester: Secondary | ICD-10-CM

## 2024-08-13 DIAGNOSIS — O09529 Supervision of elderly multigravida, unspecified trimester: Secondary | ICD-10-CM | POA: Diagnosis not present

## 2024-08-13 NOTE — Progress Notes (Signed)
   PRENATAL VISIT NOTE  Subjective:  Carly Murray is a 38 y.o. G2P0101 at [redacted]w[redacted]d being seen today for ongoing prenatal care.  She is currently monitored for the following issues for this high-risk pregnancy and has Supervision of high risk pregnancy, antepartum; UTI in pregnancy; Obesity in pregnancy; Antepartum multigravida of advanced maternal age; Prediabetes; Hypertension affecting pregnancy in third trimester; History of preterm delivery, currently pregnant; and BMI 45.0-49.9, adult (HCC) on their problem list.  Patient reports no complaints.  Contractions: Irritability. Vag. Bleeding: None.  Movement: Present. Denies leaking of fluid.   The following portions of the patient's history were reviewed and updated as appropriate: allergies, current medications, past family history, past medical history, past social history, past surgical history and problem list.   Objective:    Vitals:   08/13/24 0829 08/13/24 0836  BP: (!) 130/103 134/87  Pulse: 96 94  Weight: 295 lb (133.8 kg)     Fetal Status:  Fetal Heart Rate (bpm): 152   Movement: Present    General: Alert, oriented and cooperative. Patient is in no acute distress.  Skin: Skin is warm and dry. No rash noted.   Cardiovascular: Normal heart rate noted  Respiratory: Normal respiratory effort, no problems with respiration noted  Abdomen: Soft, gravid, appropriate for gestational age.  Pain/Pressure: Present     Pelvic: Cervical exam deferred        Extremities: Normal range of motion.  Edema: None  Mental Status: Normal mood and affect. Normal behavior. Normal judgment and thought content.   Assessment and Plan:  Pregnancy: G2P0101 at [redacted]w[redacted]d 1. Supervision of high risk pregnancy, antepartum (Primary) FHT normal  2. Hypertension affecting pregnancy in third trimester BP stable BPP weekly Growth US  coming up  3. BMI 45.0-49.9, adult (HCC)   4. Antepartum multigravida of advanced maternal age ASA 81mg   5. History of  preterm delivery, currently pregnant   Preterm labor symptoms and general obstetric precautions including but not limited to vaginal bleeding, contractions, leaking of fluid and fetal movement were reviewed in detail with the patient. Please refer to After Visit Summary for other counseling recommendations.   No follow-ups on file.  Future Appointments  Date Time Provider Department Center  08/20/2024  2:45 PM [facility] PROVIDER 1 [facility]  08/20/2024  3:00 PM [facility] US2 [facility]  08/27/2024  8:15 AM Barbra Lang PARAS, DO [facility] None  08/27/2024  2:45 PM [facility] PROVIDER 1 [facility]  08/27/2024  3:00 PM [facility] US3 [facility]  09/03/2024  8:15 AM Barbra Lang PARAS, DO [facility] None  09/03/2024  2:45 PM [facility] PROVIDER 1 [facility]  09/03/2024  3:00 PM [facility] US3 [facility]  09/08/2024  8:15 AM Synthia Raisin, CNM [facility] None  09/08/2024  2:45 PM [facility] PROVIDER 1 [facility]  09/08/2024  3:00 PM [facility] US1 [facility]    Lang PARAS Barbra, DO

## 2024-08-18 ENCOUNTER — Encounter: Payer: Self-pay | Admitting: Family Medicine

## 2024-08-20 ENCOUNTER — Ambulatory Visit

## 2024-08-20 ENCOUNTER — Ambulatory Visit: Attending: Maternal & Fetal Medicine | Admitting: Maternal & Fetal Medicine

## 2024-08-20 ENCOUNTER — Encounter (HOSPITAL_COMMUNITY): Payer: Self-pay | Admitting: Obstetrics & Gynecology

## 2024-08-20 ENCOUNTER — Inpatient Hospital Stay (HOSPITAL_COMMUNITY)
Admission: AD | Admit: 2024-08-20 | Discharge: 2024-08-20 | Disposition: A | Discharge status: Home / Self Care | Attending: Obstetrics & Gynecology | Admitting: Obstetrics & Gynecology

## 2024-08-20 DIAGNOSIS — O09213 Supervision of pregnancy with history of pre-term labor, third trimester: Secondary | ICD-10-CM | POA: Diagnosis not present

## 2024-08-20 DIAGNOSIS — O10919 Unspecified pre-existing hypertension complicating pregnancy, unspecified trimester: Secondary | ICD-10-CM

## 2024-08-20 DIAGNOSIS — O09523 Supervision of elderly multigravida, third trimester: Secondary | ICD-10-CM | POA: Insufficient documentation

## 2024-08-20 DIAGNOSIS — Z3A34 34 weeks gestation of pregnancy: Secondary | ICD-10-CM | POA: Insufficient documentation

## 2024-08-20 DIAGNOSIS — O99891 Other specified diseases and conditions complicating pregnancy: Secondary | ICD-10-CM | POA: Diagnosis not present

## 2024-08-20 DIAGNOSIS — O99213 Obesity complicating pregnancy, third trimester: Secondary | ICD-10-CM

## 2024-08-20 DIAGNOSIS — O365930 Maternal care for other known or suspected poor fetal growth, third trimester, not applicable or unspecified: Secondary | ICD-10-CM | POA: Insufficient documentation

## 2024-08-20 DIAGNOSIS — E669 Obesity, unspecified: Secondary | ICD-10-CM | POA: Diagnosis not present

## 2024-08-20 DIAGNOSIS — O99210 Obesity complicating pregnancy, unspecified trimester: Secondary | ICD-10-CM

## 2024-08-20 DIAGNOSIS — R7303 Prediabetes: Secondary | ICD-10-CM | POA: Diagnosis not present

## 2024-08-20 DIAGNOSIS — O10913 Unspecified pre-existing hypertension complicating pregnancy, third trimester: Secondary | ICD-10-CM | POA: Diagnosis present

## 2024-08-20 DIAGNOSIS — O09529 Supervision of elderly multigravida, unspecified trimester: Secondary | ICD-10-CM

## 2024-08-20 LAB — CBC
HCT: 39.3 % (ref 36.0–46.0)
Hemoglobin: 13.5 g/dL (ref 12.0–15.0)
MCH: 30.4 pg (ref 26.0–34.0)
MCHC: 34.4 g/dL (ref 30.0–36.0)
MCV: 88.5 fL (ref 80.0–100.0)
Platelets: 389 K/uL (ref 150–400)
RBC: 4.44 MIL/uL (ref 3.87–5.11)
RDW: 13.2 % (ref 11.5–15.5)
WBC: 13.8 K/uL — ABNORMAL HIGH (ref 4.0–10.5)
nRBC: 0 % (ref 0.0–0.2)

## 2024-08-20 LAB — COMPREHENSIVE METABOLIC PANEL WITH GFR
ALT: 21 U/L (ref 0–44)
AST: 18 U/L (ref 15–41)
Albumin: 2.8 g/dL — ABNORMAL LOW (ref 3.5–5.0)
Alkaline Phosphatase: 123 U/L (ref 38–126)
Anion gap: 11 (ref 5–15)
BUN: 10 mg/dL (ref 6–20)
CO2: 19 mmol/L — ABNORMAL LOW (ref 22–32)
Calcium: 8.7 mg/dL — ABNORMAL LOW (ref 8.9–10.3)
Chloride: 106 mmol/L (ref 98–111)
Creatinine, Ser: 0.72 mg/dL (ref 0.44–1.00)
GFR, Estimated: >60 mL/min (ref >60)
Glucose, Bld: 126 mg/dL — ABNORMAL HIGH (ref 70–99)
Potassium: 3.9 mmol/L (ref 3.5–5.1)
Sodium: 136 mmol/L (ref 135–145)
Total Bilirubin: 0.4 mg/dL (ref 0.0–1.2)
Total Protein: 6.5 g/dL (ref 6.5–8.1)

## 2024-08-20 LAB — PROTEIN / CREATININE RATIO, URINE
Creatinine, Urine: 144 mg/dL
Protein Creatinine Ratio: 0.1 mg/mg{creat} (ref 0.00–0.15)
Total Protein, Urine: 15 mg/dL

## 2024-08-20 MED ORDER — NIFEDIPINE ER OSMOTIC RELEASE 30 MG PO TB24
30.0000 mg | ORAL_TABLET | Freq: Once | ORAL | Status: AC
  Administered 2024-08-20: 30 mg via ORAL
  Filled 2024-08-20: qty 1

## 2024-08-20 MED ORDER — HYDRALAZINE HCL 20 MG/ML IJ SOLN: 10.0000 mg | INTRAMUSCULAR | Status: DC | PRN

## 2024-08-20 MED ORDER — LABETALOL HCL 5 MG/ML IV SOLN
40.0000 mg | INTRAVENOUS | Status: DC | PRN
Start: 2024-08-20 — End: 2024-08-20

## 2024-08-20 MED ORDER — HYDRALAZINE HCL 20 MG/ML IJ SOLN: 5.0000 mg | INTRAMUSCULAR | Status: DC | PRN

## 2024-08-20 MED ORDER — NIFEDIPINE ER OSMOTIC RELEASE 30 MG PO TB24: 30.0000 mg | ORAL_TABLET | Freq: Every morning | ORAL | 2 refills | Status: DC

## 2024-08-20 MED ORDER — LABETALOL HCL 5 MG/ML IV SOLN: 20.0000 mg | INTRAVENOUS | Status: DC | PRN

## 2024-08-20 NOTE — MAU Note (Addendum)
 Carly Murray is a 38 y.o. at [redacted]w[redacted]d here in MAU reporting: BP has been up with pregnancy.  Is not on any medication.   Sent form MFM for pre- e work up.  Denies HA, visual changes or epigastric pain.  Has had an increase in swelling in ankles. Denies vag bleeding or LOF.  Reports +FM Onset of complaint: ongoing Pain score: none Vitals:   08/20/24 1611  BP: (!) 170/93  Pulse: 94  Resp: 18  Temp: 98.6 F (37 C)  SpO2: 98%   Pt states they have been using 'burgundy cuff' (adult large), seemed to short, in needs larger cuff range.  Used regular cuff on wrist.   FHT:146 Lab orders placed from triage:  blood work drawn in triage   Unable to get urine for P/cr at this time

## 2024-08-20 NOTE — Progress Notes (Signed)
 Patient information  Patient Name: Carly Murray  Patient MRN:   [id number]  Referring practice: MFM Referring Provider: [facility] [place] ([facility])  Problem List   Patient Active Problem List   Diagnosis Date Noted   Hypertension affecting pregnancy in third trimester 07/16/2024   History of preterm delivery, currently pregnant 07/16/2024   BMI 45.0-49.9, adult (HCC) 07/16/2024   Prediabetes 04/23/2024   Obesity in pregnancy 03/26/2024   Antepartum multigravida of advanced maternal age 63/09/2024   UTI in pregnancy 03/10/2024   Supervision of high risk pregnancy, antepartum 03/03/2024    Maternal Fetal medicine Consult  Carly Murray is a 38 y.o. G2P0101 at [redacted]w[redacted]d here for ultrasound and consultation. Carly Murray is doing well today with no acute concerns. Today we focused on the following:   The patient is here for growth ultrasound and biophysical profile.  The growth is at the lower end of normal around the 12th percentile.  BPP was 8 out of 8.  The patient's blood pressure was elevated at 140/81 with a repeat of 154/100.  She has no signs or symptoms of preeclampsia.  I discussed that preeclampsia can also be asymptomatic and show up only in the blood chemistry and she needs to go to the MAU for assessment due to the near severe range blood pressures.  I discussed the clinical significance of preeclampsia and elevated blood pressure including but not limited to stroke and heart attack as well as abruption and fetal death.  She also reports that she was prescribed Procardia  but was not told why so she did not start taking it.  I instructed her to go to the MAU and if she is discharged to start the Procardia .  Blood pressure goal is less than 140/90.  Due to her elevated blood pressures in addition to the lower fetal growth I discussed that preeclampsia is likely to develop during the pregnancy and delivery should occur around 37 weeks or sooner if indicated.  The patient  had time to ask questions that were answered to her satisfaction.  She verbalized understanding and agrees to proceed with the plan below.  Recommendations - Sent to MAU for preeclampsia rule out - Blood pressure goal of less than 140/90 - Start Procardia  30 mg daily - Weekly antenatal testing has been arranged if the patient is discharged.  Review of Systems: A review of systems was performed and was negative except per HPI   Vitals and Physical Exam    08/20/2024    3:13 PM 08/20/2024    2:39 PM 08/13/2024    8:36 AM  Vitals with BMI  Systolic 154 140 865  Diastolic 100 81 87  Pulse  91 94    Sitting comfortably on the sonogram table Nonlabored breathing Normal rate and rhythm Abdomen is nontender  Past pregnancies OB History  Gravida Para Term Preterm AB Living  2 1  1  1   SAB IAB Ectopic Multiple Live Births      1    # Outcome Date GA Lbr Len/2nd Weight Sex Type Anes PTL Lv  2 Current           1 Preterm     M Vag-Spont EPI Y LIV     I spent 30 minutes reviewing the patients chart, including labs and images as well as counseling the patient about her medical conditions. Greater than 50% of the time was spent in direct face-to-face patient counseling.  Carly Murray  MFM,  [place]   08/20/2024  3:21 PM

## 2024-08-20 NOTE — Discharge Instructions (Addendum)
 START the nifedipine  for your blood pressure tomorrow morning. Take once tablet (30 mg) each morning. CALL the office if they do not call you by 9am to schedule a blood pressure check in the office.   Reasons to return to MAU at [facility]: Preeclampsia Your blood pressure is >160/110. You have a headache that will not go away after having something to eat, something to drink, and Tylenol . You have changes in your vision or upper abdominal pain that does not get better with Tylenol .  Labor/Preterm Labor Less than 36 weeks: Contractions feels like menstrual cramps. You should go to the hospital if you have more than 6 contractions in an hour, even after you have rested and drank at least 16 ounces of water.  More than 36 weeks: You begin to have strong, frequent contractions 5 minutes apart or less, each last 1 minute, these have been going on for 1-2 hours, and you cannot walk or talk during them. Your water breaks.  Sometimes it is a big gush of fluid. However, many times it may it may be much more subtle. You should go to the hospital if you have a constant leakage of fluid from your vagina, enough to soak a pad when you are walking around.  You have vaginal bleeding.  It is normal to have a small amount of spotting if your cervix was checked. If you have bleeding requiring the use of a pad, go to the hospital. You don't feel your baby moving like normal.  If you think that you baby's movement is decreased, eat a snack and rest on your left side in a quiet room for one hour. If you have not felt the baby move more than 6 times in an hour GO TO THE HOSPITAL.

## 2024-08-20 NOTE — MAU Provider Note (Signed)
 Chief Complaint:  Hypertension   HPI   None     Latanja CHESTINE BELKNAP is a 38 y.o. G2P0101 at [redacted]w[redacted]d who presents to maternity admissions for preeclampsia rule out. She saw MFM today with mildly elevated BP. Patient reported that she was not told what nifedipine  was for, so she never picked it up or started it. Denies headache, vision changes, RUQ pain. MFM sent for preeclampsia rule out. Denies vaginal bleeding, leaking of fluids. Endorses good fetal movement.   Pregnancy Course: Receives care at [facility] for [facility] . Prenatal records reviewed. Pregnancy complicated by cHTN, obesity, AMA, prediabetes, history of preterm delivery.  Past Medical History:  Diagnosis Date   Obesity    Recurrent infection of skin    OB History  Gravida Para Term Preterm AB Living  2 1  1  1   SAB IAB Ectopic Multiple Live Births      1    # Outcome Date GA Lbr Len/2nd Weight Sex Type Anes PTL Lv  2 Current           1 Preterm     M Vag-Spont EPI Y LIV   Past Surgical History:  Procedure Laterality Date   KNEE SURGERY     Family History  Problem Relation Age of Onset   Diabetes Mother    COPD Mother    Hypertension Mother    Lupus Mother    Hepatitis C Father    Social History   Tobacco Use   Smoking status: Never   Smokeless tobacco: Never  Vaping Use   Vaping status: Never Used  Substance Use Topics   Alcohol use: Not Currently    Comment: occ   Drug use: Never   No Known Allergies Medications Prior to Admission  Medication Sig Dispense Refill Last Dose/Taking   aspirin  EC 81 MG tablet Take 1 tablet (81 mg total) by mouth at bedtime. Start taking when you are [redacted] weeks pregnant for rest of pregnancy for prevention of preeclampsia 300 tablet 2 08/19/2024 Evening   Prenatal Vit-Fe Fumarate-FA (PRENATAL VITAMINS PO) Take 1 capsule by mouth daily.   08/20/2024 Morning   [DISCONTINUED] NIFEdipine  (PROCARDIA -XL/NIFEDICAL-XL) 30 MG 24 hr tablet Take 1 tablet (30 mg total) by  mouth daily. (Patient not taking: No sig reported) 30 tablet 2 Not Taking    I have reviewed patient's Past Medical Hx, Surgical Hx, Family Hx, Social Hx, medications and allergies.   ROS  Pertinent items noted in HPI and remainder of comprehensive ROS otherwise negative.   PHYSICAL EXAM  Patient Vitals for the past 24 hrs:  BP Temp Temp src Pulse Resp SpO2 Height Weight  08/20/24 1730 132/72 -- -- 98 -- -- -- --  08/20/24 1715 (!) 161/110 -- -- 97 -- -- -- --  08/20/24 1700 (!) 156/90 -- -- 98 -- -- -- --  08/20/24 1645 (!) 148/87 -- -- 92 -- -- -- --  08/20/24 1630 (!) 144/79 -- -- 92 -- -- -- --  08/20/24 1611 (!) 170/93 98.6 F (37 C) Oral 94 18 98 % 5' 2 (1.575 m) 134.5 kg    Constitutional: Well-developed, well-nourished female in no acute distress.  HEENT: atraumatic, normocephalic. Neck has normal ROM. EOM intact. Cardiovascular: normal rate & rhythm, warm and well-perfused Respiratory: normal effort, no problems with respiration noted MSK: Extremities nontender, no edema, normal ROM Skin: warm and dry. Acyanotic, no jaundice or pallor. Neurologic: Alert and oriented x 4. No abnormal coordination. Psychiatric: Normal  mood. Speech not slurred, not rapid/pressured. Patient is cooperative.  Fetal Tracing: Baseline FHR: 140 per minute Fetal heart variability: moderate Fetal Heart Rate accelerations: yes Fetal Heart Rate decelerations: none Fetal Non-stress Test: Category I (reactive) Toco: no uterine contractions   Labs: Results for orders placed or performed during the hospital encounter of 08/20/24 (from the past 24 hours)  CBC     Status: Abnormal   Collection Time: 08/20/24  4:09 PM  Result Value Ref Range   WBC 13.8 (H) 4.0 - 10.5 K/uL   RBC 4.44 3.87 - 5.11 MIL/uL   Hemoglobin 13.5 12.0 - 15.0 g/dL   HCT 60.6 63.9 - 53.9 %   MCV 88.5 80.0 - 100.0 fL   MCH 30.4 26.0 - 34.0 pg   MCHC 34.4 30.0 - 36.0 g/dL   RDW 86.7 88.4 - 84.4 %   Platelets 389 150 - 400  K/uL   nRBC 0.0 0.0 - 0.2 %  Comprehensive metabolic panel with GFR     Status: Abnormal   Collection Time: 08/20/24  4:09 PM  Result Value Ref Range   Sodium 136 135 - 145 mmol/L   Potassium 3.9 3.5 - 5.1 mmol/L   Chloride 106 98 - 111 mmol/L   CO2 19 (L) 22 - 32 mmol/L   Glucose, Bld 126 (H) 70 - 99 mg/dL   BUN 10 6 - 20 mg/dL   Creatinine, Ser 9.27 0.44 - 1.00 mg/dL   Calcium 8.7 (L) 8.9 - 10.3 mg/dL   Total Protein 6.5 6.5 - 8.1 g/dL   Albumin 2.8 (L) 3.5 - 5.0 g/dL   AST 18 15 - 41 U/L   ALT 21 0 - 44 U/L   Alkaline Phosphatase 123 38 - 126 U/L   Total Bilirubin 0.4 0.0 - 1.2 mg/dL   GFR, Estimated >39 >39 mL/min   Anion gap 11 5 - 15  Protein / creatinine ratio, urine     Status: None   Collection Time: 08/20/24  4:16 PM  Result Value Ref Range   Creatinine, Urine 144 mg/dL   Total Protein, Urine 15 mg/dL   Protein Creatinine Ratio 0.10 0.00 - 0.15 mg/mg[Cre]    Imaging:  US  MFM OB FOLLOW UP Result Date: 08/20/2024 ----------------------------------------------------------------------  OBSTETRICS REPORT                       (Signed Final 08/20/2024 03:33 pm) ---------------------------------------------------------------------- Patient Info  ID #:       [id number]                          D.O.B.:  Jun 28, 1986 (38 yrs)(F)  Name:       JENELLE GORMAN DICKINSON               Visit Date: 08/20/2024 12:23 pm ---------------------------------------------------------------------- Performed By  Attending:        Delora Smaller DO       Ref. Address:     [address]                                                             [address]  Performed By:     Jonette Nap        [facility]                                  [facility]  Referred By:      [facility]  ---------------------------------------------------------------------- Orders  #  Description                           Code        Ordered By  1  US  MFM OB FOLLOW UP                   [id number]    YU FANG  2  US  MFM FETAL BPP WO NON               76819.01    YU FANG     STRESS ----------------------------------------------------------------------  #  Order #                     Accession #                Episode #  1  [id number]                   [id number]                 [id number]  2  [id number]                   [id number]                 [id number] ---------------------------------------------------------------------- Indications  Poor fetal growth, third trimester             O36.5930  Obesity complicating pregnancy, third          O99.213  trimester (BMI 49)  Advanced maternal age multigravida 36+,        O35.523  third trimester  Poor obstetric history: Previous preterm       O09.219  delivery, antepartum  Low risk NIPS, NEG Horizon  [redacted] weeks gestation of pregnancy                Z3A.34  Passed GTT ---------------------------------------------------------------------- Fetal Evaluation  Num Of Fetuses:         1  Fetal Heart Rate(bpm):  138  Cardiac Activity:       Observed  Presentation:           Cephalic  Placenta:               Anterior  P. Cord Insertion:  Previously seen  Amniotic Fluid  AFI FV:      Subjectively low-normal  AFI Sum(cm)     %Tile       Largest Pocket(cm)  9.26            14          3.43  RUQ(cm)       RLQ(cm)       LUQ(cm)        LLQ(cm)  3.2           3.43          2.63           0 ---------------------------------------------------------------------- Biophysical Evaluation  Amniotic F.V:   Pocket => 2 cm             F. Tone:        Observed  F. Movement:    Observed                   Score:          8/8  F. Breathing:   Observed ---------------------------------------------------------------------- Biometry  BPD:        83  mm     G. Age:  33w 3d         23  %    CI:        78.84   %     70 - 86                                                          FL/HC:      21.2   %    19.4 - 21.8  HC:      295.6  mm     G. Age:  32w 5d        1.5  %    HC/AC:      1.01        0.96 - 1.11  AC:      293.3  mm     G. Age:  33w 2d         27  %    FL/BPD:     75.4   %    71 - 87  FL:       62.6  mm     G. Age:  32w 3d          6  %    FL/AC:      21.3   %    20 - 24  Est. FW:    2096  gm    4 lb 10 oz      13  % ---------------------------------------------------------------------- OB History  Gravidity:    2         Prem:   1  Living:       1 ---------------------------------------------------------------------- Gestational Age  LMP:           35w 2d        Date:  12/17/23                   EDD:   09/22/24  U/S Today:     33w 0d  EDD:   10/08/24  Best:          34w 2d     Det. By:  JAYSON JONELLE CROME 1st  (02/13/24)    EDD:   09/29/24 ---------------------------------------------------------------------- Anatomy  Cranium:               Previously seen        Aortic Arch:            Previously seen  Cavum:                 Previously seen        Ductal Arch:            Previously seen  Ventricles:            Appears normal         Diaphragm:              Appears normal  Choroid Plexus:        Previously seen        Stomach:                Appears normal, left                                                                        sided  Cerebellum:            Previously seen        Abdomen:                Previously seen  Posterior Fossa:       Previously seen        Abdominal Wall:         Previously seen  Face:                  Orbits and profile     Cord Vessels:           Previously seen                         previously seen  Lips:                  Previously seen        Kidneys:                Appear normal  Thoracic:              Previously seen        Bladder:                Appears normal  Heart:                 Previously seen        Spine:                  Previously seen  RVOT:                   Previously seen        Upper Extremities:      Previously seen  LVOT:                  Previously seen  Lower Extremities:      Previously seen  Other:  AA remains not well visualized due to fetal position and maternal          habitus. ---------------------------------------------------------------------- Comments  Sonographic findings  Single intrauterine pregnancy at 34w 2d.  Fetal cardiac activity: Observed.  Presentation: Cephalic.  Interval fetal anatomy appears normal.  Fetal biometry shows the estimated fetal weight at the 13  percentile.  Amniotic fluid: Subjectively low-normal.  MVP: 3.43 cm.  Placenta: Anterior.  BPP: 8/8.  There are limitations of prenatal ultrasound such as the  inability to detect certain abnormalities due to poor  visualization. Various factors such as fetal position,  gestational age and maternal body habitus may increase the  difficulty in visualizing the fetal anatomy.  Recommendations  - See Epic note for assessment and plan of care. Any  referring office that does not utilize Epic will recieve a copy of  today's consult note via fax. Please contact our office with  any concerns. ----------------------------------------------------------------------                  Delora Smaller, DO Electronically Signed Final Report   08/20/2024 03:33 pm ----------------------------------------------------------------------   US  MFM FETAL BPP WO NON STRESS Result Date: 08/20/2024 ----------------------------------------------------------------------  OBSTETRICS REPORT                       (Signed Final 08/20/2024 03:33 pm) ---------------------------------------------------------------------- Patient Info  ID #:       [id number]                          D.O.B.:  1986-04-07 (38 yrs)(F)  Name:       JENELLE GORMAN DICKINSON               Visit Date: 08/20/2024 12:23 pm ---------------------------------------------------------------------- Performed By  Attending:        Delora Smaller DO       Ref. Address:     [address]                                                             [address]  Performed By:     Jonette Nap        Location[facility]                                  [facility]  Referred By:      [facility] ---------------------------------------------------------------------- Orders  #  Description                           Code        Ordered By  1  US  MFM OB FOLLOW UP                   [id number]    YU FANG  2  US  MFM FETAL BPP WO NON               [id number]    YU FANG     STRESS ----------------------------------------------------------------------  #  Order #                     Accession #                Episode #  1  [id number]                   [id number]                 [id number]  2  [id number]                   [id number]                 [id number] ---------------------------------------------------------------------- Indications  Poor fetal growth, third trimester             O36.5930  Obesity complicating pregnancy, third          O99.213  trimester (BMI 32)  Advanced maternal age multigravida 43+,        O57.523  third trimester  Poor obstetric history: Previous preterm       O09.219  delivery, antepartum  Low risk NIPS, NEG Horizon  [redacted] weeks gestation of pregnancy                Z3A.34  Passed GTT ---------------------------------------------------------------------- Fetal Evaluation  Num Of Fetuses:         1  Fetal Heart Rate(bpm):  138  Cardiac Activity:       Observed  Presentation:           Cephalic  Placenta:               Anterior  P. Cord Insertion:      Previously seen  Amniotic Fluid  AFI FV:      Subjectively low-normal  AFI Sum(cm)     %Tile       Largest Pocket(cm)  9.26            14          3.43  RUQ(cm)       RLQ(cm)       LUQ(cm)        LLQ(cm)  3.2            3.43          2.63           0 ---------------------------------------------------------------------- Biophysical Evaluation  Amniotic F.V:   Pocket => 2 cm             F. Tone:        Observed  F. Movement:    Observed                   Score:  8/8  F. Breathing:   Observed ---------------------------------------------------------------------- Biometry  BPD:        83  mm     G. Age:  33w 3d         23  %    CI:        78.84   %    70 - 86                                                          FL/HC:      21.2   %    19.4 - 21.8  HC:      295.6  mm     G. Age:  32w 5d        1.5  %    HC/AC:      1.01        0.96 - 1.11  AC:      293.3  mm     G. Age:  33w 2d         27  %    FL/BPD:     75.4   %    71 - 87  FL:       62.6  mm     G. Age:  32w 3d          6  %    FL/AC:      21.3   %    20 - 24  Est. FW:    2096  gm    4 lb 10 oz      13  % ---------------------------------------------------------------------- OB History  Gravidity:    2         Prem:   1  Living:       1 ---------------------------------------------------------------------- Gestational Age  LMP:           35w 2d        Date:  12/17/23                   EDD:   09/22/24  U/S Today:     33w 0d                                        EDD:   10/08/24  Best:          34w 2d     Det. By:  JAYSON JONELLE CROME 1st  (02/13/24)    EDD:   09/29/24 ---------------------------------------------------------------------- Anatomy  Cranium:               Previously seen        Aortic Arch:            Previously seen  Cavum:                 Previously seen        Ductal Arch:            Previously seen  Ventricles:            Appears normal         Diaphragm:              Appears normal  Choroid Plexus:  Previously seen        Stomach:                Appears normal, left                                                                        sided  Cerebellum:            Previously seen        Abdomen:                Previously seen  Posterior Fossa:        Previously seen        Abdominal Wall:         Previously seen  Face:                  Orbits and profile     Cord Vessels:           Previously seen                         previously seen  Lips:                  Previously seen        Kidneys:                Appear normal  Thoracic:              Previously seen        Bladder:                Appears normal  Heart:                 Previously seen        Spine:                  Previously seen  RVOT:                  Previously seen        Upper Extremities:      Previously seen  LVOT:                  Previously seen        Lower Extremities:      Previously seen  Other:  AA remains not well visualized due to fetal position and maternal          habitus. ---------------------------------------------------------------------- Comments  Sonographic findings  Single intrauterine pregnancy at 34w 2d.  Fetal cardiac activity: Observed.  Presentation: Cephalic.  Interval fetal anatomy appears normal.  Fetal biometry shows the estimated fetal weight at the 13  percentile.  Amniotic fluid: Subjectively low-normal.  MVP: 3.43 cm.  Placenta: Anterior.  BPP: 8/8.  There are limitations of prenatal ultrasound such as the  inability to detect certain abnormalities due to poor  visualization. Various factors such as fetal position,  gestational age and maternal body habitus may increase the  difficulty in visualizing the fetal anatomy.  Recommendations  - See Epic note for assessment and plan of care. Any  referring office that does not utilize Epic will recieve a copy of  today's consult note via  fax. Please contact our office with  any concerns. ----------------------------------------------------------------------                  Delora Smaller, DO Electronically Signed Final Report   08/20/2024 03:33 pm ----------------------------------------------------------------------   MDM & MAU COURSE  MDM: Moderate  MAU Course: -Severe range blood pressure initially using normal  cuff, repeated using large cuff with only mild range BP. -CMP, CBC, urine protein/creatinine ratio to rule out preeclampsia.  -CBC and CMP without significant abnormalities for pregnancy. -BPP 8/8 with MFM. Per Dr. Jesus, if discharged recommend starting Procardia  30 mg daily. Deliver at 37 weeks. Weekly antenatal testing has already been arranged. -BP has been mildly elevated when measured correctly: initial severe range BP of 170/93 was taken with small cuff which is not an appropriate size for her, repeat with large cuff was mild range. Other severe range BP 161/110 due to twisted BP cuff, RN fixed cuff and next BP normalized. -Patient remains asymptomatic. -Discussed with Dr. Herchel. Will give one dose Procardia  XL 30 mg before discharge. Start Procardia  XL 30 mg daily tomorrow morning, and follow up in office for BP check tomorrow.  Differential diagnosis considered for elevated blood pressure includes but is not limited to: high risk conditions like preeclampsia or gestational hypertension; chronic hypertension; transient spurious elevated blood pressure   Orders Placed This Encounter  Procedures   CBC   Comprehensive metabolic panel with GFR   Protein / creatinine ratio, urine   Notify physician (specify) Confirmatory reading of BP> 160/110 15 minutes later   Apply Hypertensive Disorders of Pregnancy Care Plan   Measure blood pressure   Discharge patient   Meds ordered this encounter  Medications   AND Linked Order Group    hydrALAZINE (APRESOLINE) injection 5 mg    hydrALAZINE (APRESOLINE) injection 10 mg    labetalol (NORMODYNE) injection 20 mg    labetalol (NORMODYNE) injection 40 mg   NIFEdipine  (PROCARDIA -XL/NIFEDICAL-XL) 24 hr tablet 30 mg   NIFEdipine  (PROCARDIA -XL/NIFEDICAL-XL) 30 MG 24 hr tablet    Sig: Take 1 tablet (30 mg total) by mouth every morning.    Dispense:  30 tablet    Refill:  2    ASSESSMENT   1. Chronic hypertension affecting pregnancy   2. [redacted]  weeks gestation of pregnancy     PLAN  Discharge home in stable condition with preeclampsia precautions.  START nifedipine  30 mg every morning. BP check in office tomorrow, stat message sent to office staff.    Follow-up Information     [facility] Follow up in 1 day(s).   Specialty: Obstetrics and Gynecology Why: blood pressure check Contact information: [address] [facility] [place]  [address] [phone]                 Allergies as of 08/20/2024   No Known Allergies      Medication List     TAKE these medications    aspirin  EC 81 MG tablet Take 1 tablet (81 mg total) by mouth at bedtime. Start taking when you are [redacted] weeks pregnant for rest of pregnancy for prevention of preeclampsia   NIFEdipine  30 MG 24 hr tablet Commonly known as: PROCARDIA -XL/NIFEDICAL-XL Take 1 tablet (30 mg total) by mouth every morning. What changed: when to take this   PRENATAL VITAMINS PO Take 1 capsule by mouth daily.        Joesph DELENA Sear, PA

## 2024-08-21 ENCOUNTER — Telehealth: Payer: Self-pay

## 2024-08-21 NOTE — Telephone Encounter (Signed)
 Spoke with patient regarding coming into the office for a BP check. Patient stated that she is at work in [place] and unable to come in this morning. She said that she is feeling good with her new medication.   Patient stated she will take blood pressure at home and continue to report elevated readings. Patient knows that when our office is closed she can call our phone number and speak with the triage team. She also knows to return to MAU if symptoms persist.

## 2024-08-21 NOTE — Telephone Encounter (Signed)
-----   Message from Joesph DELENA Sear sent at 08/20/2024  5:50 PM EST ----- Please call first thing to get her scheduled for a BP check in the office Friday November 7th.

## 2024-08-27 ENCOUNTER — Ambulatory Visit

## 2024-08-27 ENCOUNTER — Ambulatory Visit: Attending: Obstetrics | Admitting: Obstetrics

## 2024-08-27 ENCOUNTER — Ambulatory Visit (INDEPENDENT_AMBULATORY_CARE_PROVIDER_SITE_OTHER): Admitting: Family Medicine

## 2024-08-27 VITALS — BP 155/89 | HR 93 | Wt 296.0 lb

## 2024-08-27 DIAGNOSIS — O09523 Supervision of elderly multigravida, third trimester: Secondary | ICD-10-CM | POA: Insufficient documentation

## 2024-08-27 DIAGNOSIS — Z3A35 35 weeks gestation of pregnancy: Secondary | ICD-10-CM | POA: Diagnosis not present

## 2024-08-27 DIAGNOSIS — O99210 Obesity complicating pregnancy, unspecified trimester: Secondary | ICD-10-CM

## 2024-08-27 DIAGNOSIS — O99213 Obesity complicating pregnancy, third trimester: Secondary | ICD-10-CM | POA: Diagnosis not present

## 2024-08-27 DIAGNOSIS — O09529 Supervision of elderly multigravida, unspecified trimester: Secondary | ICD-10-CM

## 2024-08-27 DIAGNOSIS — O09213 Supervision of pregnancy with history of pre-term labor, third trimester: Secondary | ICD-10-CM | POA: Insufficient documentation

## 2024-08-27 DIAGNOSIS — Z6841 Body Mass Index (BMI) 40.0 and over, adult: Secondary | ICD-10-CM | POA: Diagnosis not present

## 2024-08-27 DIAGNOSIS — O10013 Pre-existing essential hypertension complicating pregnancy, third trimester: Secondary | ICD-10-CM | POA: Diagnosis not present

## 2024-08-27 DIAGNOSIS — E669 Obesity, unspecified: Secondary | ICD-10-CM | POA: Diagnosis not present

## 2024-08-27 DIAGNOSIS — O365930 Maternal care for other known or suspected poor fetal growth, third trimester, not applicable or unspecified: Secondary | ICD-10-CM | POA: Diagnosis present

## 2024-08-27 DIAGNOSIS — O0990 Supervision of high risk pregnancy, unspecified, unspecified trimester: Secondary | ICD-10-CM | POA: Diagnosis not present

## 2024-08-27 DIAGNOSIS — O10913 Unspecified pre-existing hypertension complicating pregnancy, third trimester: Secondary | ICD-10-CM

## 2024-08-27 DIAGNOSIS — O10919 Unspecified pre-existing hypertension complicating pregnancy, unspecified trimester: Secondary | ICD-10-CM

## 2024-08-27 DIAGNOSIS — N871 Moderate cervical dysplasia: Secondary | ICD-10-CM

## 2024-08-27 DIAGNOSIS — O09899 Supervision of other high risk pregnancies, unspecified trimester: Secondary | ICD-10-CM

## 2024-08-27 LAB — POCT URINALYSIS DIPSTICK OB
Bilirubin, UA: NEGATIVE
Glucose, UA: NEGATIVE
Ketones, UA: NEGATIVE
Leukocytes, UA: NEGATIVE
Nitrite, UA: NEGATIVE
Spec Grav, UA: 1.015 (ref 1.010–1.025)
Urobilinogen, UA: 0.2 U/dL
pH, UA: 6 (ref 5.0–8.0)

## 2024-08-27 NOTE — Addendum Note (Signed)
 Addended by: TANDA CREE L on: 08/27/2024 10:40 AM   Modules accepted: Orders

## 2024-08-27 NOTE — Progress Notes (Signed)
 PRENATAL VISIT NOTE  Subjective:  Carly Murray is a 38 y.o. G2P0101 at [redacted]w[redacted]d being seen today for ongoing prenatal care.  She is currently monitored for the following issues for this high-risk pregnancy and has Supervision of high risk pregnancy, antepartum; UTI in pregnancy; Obesity in pregnancy; Antepartum multigravida of advanced maternal age; Prediabetes; Chronic hypertension affecting pregnancy; History of preterm delivery, currently pregnant; BMI 45.0-49.9, adult (HCC); and Moderate dysplasia of cervix (CIN II) on their problem list.  Patient reports no complaints. No headaches, vision changes, abdominal pain. Took procardia  this AM around 6:30. Contractions: Irritability. Vag. Bleeding: None.  Movement: Present. Denies leaking of fluid.   The following portions of the patient's history were reviewed and updated as appropriate: allergies, current medications, past family history, past medical history, past social history, past surgical history and problem list.   Objective:   Vitals:   08/27/24 0820 08/27/24 0827  BP: (!) 146/102 (!) 155/89  Pulse: 98 93  Weight: 296 lb (134.3 kg)     Fetal Status:  Fetal Heart Rate (bpm): 143   Movement: Present    General: Alert, oriented and cooperative. Patient is in no acute distress.  Skin: Skin is warm and dry. No rash noted.   Cardiovascular: Normal heart rate noted  Respiratory: Normal respiratory effort, no problems with respiration noted  Abdomen: Soft, gravid, appropriate for gestational age.  Pain/Pressure: Present     Pelvic: Cervical exam deferred        Extremities: Normal range of motion.  Edema: Trace  Mental Status: Normal mood and affect. Normal behavior. Normal judgment and thought content.      03/26/2024    9:33 AM  Depression screen PHQ 2/9  Decreased Interest 0  Down, Depressed, Hopeless 0  PHQ - 2 Score 0  Altered sleeping 0  Tired, decreased energy 2  Change in appetite 0  Feeling bad or failure about  yourself  0  Trouble concentrating 0  Moving slowly or fidgety/restless 0  Suicidal thoughts 0  PHQ-9 Score 2      Data saved with a previous flowsheet row definition        03/26/2024    9:34 AM  GAD 7 : Generalized Anxiety Score  Nervous, Anxious, on Edge 2  Control/stop worrying 0  Worry too much - different things 1  Trouble relaxing 0  Restless 0  Easily annoyed or irritable 0  Afraid - awful might happen 0  Total GAD 7 Score 3    Assessment and Plan:  Pregnancy: G2P0101 at [redacted]w[redacted]d 1. [redacted] weeks gestation of pregnancy (Primary)  2. Supervision of high risk pregnancy, antepartum FHT normal  3. Chronic hypertension affecting pregnancy BP elevated today despite taking procardia  this am. Will have her take an extra dose this morning and start taking it BID. She has a bp cuff at home and will check BPs tomorrow.  Check labs. May need delivery at 37 weeks if continuing to escalate dose BPP today - Protein / creatinine ratio, urine - Comp Met (CMET) - CBC  4. BMI 45.0-49.9, adult (HCC)  5. Antepartum multigravida of advanced maternal age  26. Moderate dysplasia of cervix (CIN II)  7. History of preterm delivery, currently pregnant   Preterm labor symptoms and general obstetric precautions including but not limited to vaginal bleeding, contractions, leaking of fluid and fetal movement were reviewed in detail with the patient. Please refer to After Visit Summary for other counseling recommendations.   No follow-ups on file.  Future Appointments  Date Time Provider Department Center  08/27/2024  2:45 PM [facility] PROVIDER 1 [facility]  08/27/2024  3:00 PM [facility] US3 [facility]  09/03/2024  8:15 AM Barbra Lang PARAS, DO [facility] None  09/03/2024  2:45 PM [facility] PROVIDER 1 [facility]  09/03/2024  3:00 PM [facility] US3 [facility]  09/08/2024  8:15 AM Synthia Raisin, CNM [facility] None  09/08/2024  2:45 PM [facility] PROVIDER 1 [facility]  09/08/2024  3:00 PM [facility]  US1 [facility]    Lang PARAS Barbra, DO

## 2024-08-27 NOTE — Progress Notes (Signed)
 MFM Consult Note  Carly Murray is currently at 35 weeks and 2 days.  She has been followed due to advanced maternal age (38 years old), maternal obesity with a BMI of 49, and chronic hypertension treated with nifedipine .  She denies any problems since her last exam.  Her blood pressures today were 143/87 and 146/79.  Sonographic findings Single intrauterine pregnancy at 35w 2d. Fetal cardiac activity: Observed. Presentation: Cephalic. Amniotic fluid: Within normal limits.  AFI: 12.89 cm. Placenta: Anterior. BPP: 8/8.   Due to her underlying medical conditions and her mildly elevated blood pressures, delivery should be considered at between 37 to 38 weeks.   We will continue to follow her with weekly fetal testing until delivery.    She will return in 1 week for another BPP.    Preeclampsia precautions were reviewed today.  The patient stated that all of her questions were answered today.  A total of 20 minutes was spent counseling and coordinating the care for this patient.  Greater than 50% of the time was spent in direct face-to-face contact.

## 2024-08-28 ENCOUNTER — Encounter: Payer: Self-pay | Admitting: Family Medicine

## 2024-08-28 LAB — CBC
Hematocrit: 41.1 % (ref 34.0–46.6)
Hemoglobin: 13.4 g/dL (ref 11.1–15.9)
MCH: 30.2 pg (ref 26.6–33.0)
MCHC: 32.6 g/dL (ref 31.5–35.7)
MCV: 93 fL (ref 79–97)
Platelets: 421 x10E3/uL (ref 150–450)
RBC: 4.43 x10E6/uL (ref 3.77–5.28)
RDW: 12.8 % (ref 11.7–15.4)
WBC: 15 x10E3/uL — ABNORMAL HIGH (ref 3.4–10.8)

## 2024-08-28 LAB — COMPREHENSIVE METABOLIC PANEL WITH GFR
ALT: 19 IU/L (ref 0–32)
AST: 15 IU/L (ref 0–40)
Albumin: 3.6 g/dL — ABNORMAL LOW (ref 3.9–4.9)
Alkaline Phosphatase: 168 IU/L — ABNORMAL HIGH (ref 41–116)
BUN/Creatinine Ratio: 11 (ref 9–23)
BUN: 7 mg/dL (ref 6–20)
Bilirubin Total: <0.2 mg/dL (ref 0.0–1.2)
CO2: 16 mmol/L — ABNORMAL LOW (ref 20–29)
Calcium: 9.8 mg/dL (ref 8.7–10.2)
Chloride: 105 mmol/L (ref 96–106)
Creatinine, Ser: 0.65 mg/dL (ref 0.57–1.00)
Globulin, Total: 3 g/dL (ref 1.5–4.5)
Glucose: 138 mg/dL — ABNORMAL HIGH (ref 70–99)
Potassium: 4.4 mmol/L (ref 3.5–5.2)
Sodium: 136 mmol/L (ref 134–144)
Total Protein: 6.6 g/dL (ref 6.0–8.5)
eGFR: 116 mL/min/1.73 (ref >59)

## 2024-08-28 LAB — PROTEIN / CREATININE RATIO, URINE
Creatinine, Urine: 114.5 mg/dL
Protein, Ur: 15.2 mg/dL
Protein/Creat Ratio: 133 mg/g{creat} (ref 0–200)

## 2024-08-30 ENCOUNTER — Inpatient Hospital Stay (HOSPITAL_COMMUNITY)
Admission: AD | Admit: 2024-08-30 | Discharge: 2024-08-31 | Disposition: A | Discharge status: Home / Self Care | Attending: Family Medicine | Admitting: Family Medicine

## 2024-08-30 ENCOUNTER — Encounter (HOSPITAL_COMMUNITY): Payer: Self-pay | Admitting: Obstetrics and Gynecology

## 2024-08-30 DIAGNOSIS — Z79899 Other long term (current) drug therapy: Secondary | ICD-10-CM | POA: Diagnosis not present

## 2024-08-30 DIAGNOSIS — O09523 Supervision of elderly multigravida, third trimester: Secondary | ICD-10-CM | POA: Insufficient documentation

## 2024-08-30 DIAGNOSIS — R202 Paresthesia of skin: Secondary | ICD-10-CM | POA: Insufficient documentation

## 2024-08-30 DIAGNOSIS — O99213 Obesity complicating pregnancy, third trimester: Secondary | ICD-10-CM | POA: Insufficient documentation

## 2024-08-30 DIAGNOSIS — O10913 Unspecified pre-existing hypertension complicating pregnancy, third trimester: Secondary | ICD-10-CM | POA: Diagnosis present

## 2024-08-30 DIAGNOSIS — O10919 Unspecified pre-existing hypertension complicating pregnancy, unspecified trimester: Secondary | ICD-10-CM | POA: Diagnosis present

## 2024-08-30 DIAGNOSIS — O99210 Obesity complicating pregnancy, unspecified trimester: Secondary | ICD-10-CM

## 2024-08-30 DIAGNOSIS — Z3A35 35 weeks gestation of pregnancy: Secondary | ICD-10-CM | POA: Diagnosis not present

## 2024-08-30 DIAGNOSIS — Z3689 Encounter for other specified antenatal screening: Secondary | ICD-10-CM

## 2024-08-30 DIAGNOSIS — Z8249 Family history of ischemic heart disease and other diseases of the circulatory system: Secondary | ICD-10-CM | POA: Diagnosis not present

## 2024-08-30 DIAGNOSIS — O26893 Other specified pregnancy related conditions, third trimester: Secondary | ICD-10-CM | POA: Insufficient documentation

## 2024-08-30 NOTE — MAU Note (Signed)
 MAU Triage Note: Carly Murray is a 38 y.o. at [redacted]w[redacted]d here in MAU reporting: HBP at home along with tingling in her hands and feet. She doesn't notice any increase in her swelling, but does report on-going BLE swelling; non-pitting. Denies VB or LOF. Reports +FM.   PIH Assessment: Headache present: No  Visual disturbances: None RUQ pain/Epigastric: None Atypical edema: ; ongoing/no abrupt changes Hx of HBP: CHTN    Patient complaint: hbp swelling  Pain Score: 0-No pain     Onset of complaint: today LMP: Patient's last menstrual period was 12/17/2023 (exact date).  Vitals:   08/30/24 2335  BP: 127/79  Pulse: (!) 107  Resp: 18  Temp: 98.3 F (36.8 C)  SpO2: 100%    FHT:  Fetal Heart Rate Mode: External Baseline Rate (A): 165 bpm Lab orders placed from triage: UA

## 2024-08-31 DIAGNOSIS — Z3A35 35 weeks gestation of pregnancy: Secondary | ICD-10-CM

## 2024-08-31 DIAGNOSIS — O10913 Unspecified pre-existing hypertension complicating pregnancy, third trimester: Secondary | ICD-10-CM

## 2024-08-31 LAB — URINALYSIS, ROUTINE W REFLEX MICROSCOPIC
Bilirubin Urine: NEGATIVE
Glucose, UA: 50 mg/dL — AB
Ketones, ur: NEGATIVE mg/dL
Leukocytes,Ua: NEGATIVE
Nitrite: NEGATIVE
Protein, ur: NEGATIVE mg/dL
Specific Gravity, Urine: 1.025 (ref 1.005–1.030)
pH: 5 (ref 5.0–8.0)

## 2024-08-31 LAB — CBC WITH DIFFERENTIAL/PLATELET
Abs Immature Granulocytes: 0.1 K/uL — ABNORMAL HIGH (ref 0.00–0.07)
Basophils Absolute: 0 K/uL (ref 0.0–0.1)
Basophils Relative: 0 %
Eosinophils Absolute: 0.1 K/uL (ref 0.0–0.5)
Eosinophils Relative: 1 %
HCT: 39 % (ref 36.0–46.0)
Hemoglobin: 13.3 g/dL (ref 12.0–15.0)
Immature Granulocytes: 1 %
Lymphocytes Relative: 29 %
Lymphs Abs: 4.7 K/uL — ABNORMAL HIGH (ref 0.7–4.0)
MCH: 30.6 pg (ref 26.0–34.0)
MCHC: 34.1 g/dL (ref 30.0–36.0)
MCV: 89.7 fL (ref 80.0–100.0)
Monocytes Absolute: 1.2 K/uL — ABNORMAL HIGH (ref 0.1–1.0)
Monocytes Relative: 7 %
Neutro Abs: 9.9 K/uL — ABNORMAL HIGH (ref 1.7–7.7)
Neutrophils Relative %: 62 %
Platelets: 417 K/uL — ABNORMAL HIGH (ref 150–400)
RBC: 4.35 MIL/uL (ref 3.87–5.11)
RDW: 13.2 % (ref 11.5–15.5)
WBC: 16 K/uL — ABNORMAL HIGH (ref 4.0–10.5)
nRBC: 0 % (ref 0.0–0.2)

## 2024-08-31 LAB — COMPREHENSIVE METABOLIC PANEL WITH GFR
ALT: 21 U/L (ref 0–44)
AST: 17 U/L (ref 15–41)
Albumin: 2.7 g/dL — ABNORMAL LOW (ref 3.5–5.0)
Alkaline Phosphatase: 142 U/L — ABNORMAL HIGH (ref 38–126)
Anion gap: 7 (ref 5–15)
BUN: 10 mg/dL (ref 6–20)
CO2: 19 mmol/L — ABNORMAL LOW (ref 22–32)
Calcium: 9.5 mg/dL (ref 8.9–10.3)
Chloride: 110 mmol/L (ref 98–111)
Creatinine, Ser: 0.64 mg/dL (ref 0.44–1.00)
GFR, Estimated: >60 mL/min (ref >60)
Glucose, Bld: 123 mg/dL — ABNORMAL HIGH (ref 70–99)
Potassium: 3.8 mmol/L (ref 3.5–5.1)
Sodium: 136 mmol/L (ref 135–145)
Total Bilirubin: 0.2 mg/dL (ref 0.0–1.2)
Total Protein: 6.8 g/dL (ref 6.5–8.1)

## 2024-08-31 LAB — PROTEIN / CREATININE RATIO, URINE
Creatinine, Urine: 198 mg/dL
Protein Creatinine Ratio: 0.09 mg/mg{creat} (ref 0.00–0.15)
Total Protein, Urine: 17 mg/dL

## 2024-08-31 NOTE — Discharge Instructions (Signed)
 You were seen for elevated blood pressures. They were normal here in the MAU and you labs were reassuring.  You were discharged in stable condition. Please follow up at your next scheduled appointment.

## 2024-08-31 NOTE — MAU Provider Note (Addendum)
 History  Carly Murray is a 38 y.o. female G58P0101 with IUP at [redacted]w[redacted]d by LMP presenting for elevated blood pressure and distal extremity tingling.   She reports mild lower extremity swelling +FMs, No LOF, no VB, no blurry vision, no headaches and no RUQ pain. This high risk pregnancy is complicated by advanced maternal age and obesity. She reports a history of preterm delivery and chronic hypertension treated with nifedinpine.  She receives her prenatal care at [facility].  Patient Active Problem List   Diagnosis Date Noted   Chronic hypertension affecting pregnancy 07/16/2024   History of preterm delivery, currently pregnant 07/16/2024   BMI 45.0-49.9, adult (HCC) 07/16/2024   Prediabetes 04/23/2024   Obesity in pregnancy 03/26/2024   Antepartum multigravida of advanced maternal age 24/09/2024   UTI in pregnancy 03/10/2024   Supervision of high risk pregnancy, antepartum 03/03/2024   Moderate dysplasia of cervix (CIN II) 01/12/2015    Chief Complaint  Patient presents with   Hypertension   Tingling   She reports a recent increase in her Procardia  to BID dosing. She has been monitoring her BP at home, and has regularly recorded values around 130/90. However, she reports a single measurement of 170/100 at 2000 today, which prompted her to come in. She also reports intermittent tingling on her hands a feet over the past 2 days, and some increased swelling on her ankles. She reports mild abdominal cramping which she believes are braxton hicks contractions. She denies any other complaints at this time and denies fever, headaches, lightheadedness, visual disturbances, chest pain, SOB, abdominal pain, dysuria, or weakness.    OB History     Gravida  2   Para  1   Term      Preterm  1   AB      Living  1      SAB      IAB      Ectopic      Multiple      Live Births  1           Past Medical History:  Diagnosis Date   Obesity    Recurrent infection of skin      Past Surgical History:  Procedure Laterality Date   KNEE SURGERY      Family History  Problem Relation Age of Onset   Diabetes Mother    COPD Mother    Hypertension Mother    Lupus Mother    Hepatitis C Father     Social History   Tobacco Use   Smoking status: Never   Smokeless tobacco: Never  Vaping Use   Vaping status: Never Used  Substance Use Topics   Alcohol use: Not Currently    Comment: occ   Drug use: Never    Allergies: No Known Allergies  Medications Prior to Admission  Medication Sig Dispense Refill Last Dose/Taking   aspirin  EC 81 MG tablet Take 1 tablet (81 mg total) by mouth at bedtime. Start taking when you are [redacted] weeks pregnant for rest of pregnancy for prevention of preeclampsia 300 tablet 2 08/30/2024   NIFEdipine  (PROCARDIA -XL/NIFEDICAL-XL) 30 MG 24 hr tablet Take 1 tablet (30 mg total) by mouth every morning. 30 tablet 2 08/30/2024 at  7:00 PM   Prenatal Vit-Fe Fumarate-FA (PRENATAL VITAMINS PO) Take 1 capsule by mouth daily.       Review of Systems  Constitutional:  Negative for chills and fever.  Eyes:  Negative for blurred vision and double vision.  Respiratory:  Negative for shortness of breath.   Cardiovascular:  Negative for chest pain.  Gastrointestinal:  Negative for abdominal pain.  Genitourinary:  Negative for dysuria.  Neurological:  Positive for tingling. Negative for dizziness, weakness and headaches.    See HPI Above Physical Exam   Blood pressure 130/71, pulse 87, temperature 98.3 F (36.8 C), temperature source Oral, resp. rate 18, height 5' 2 (1.575 m), weight 135.9 kg, last menstrual period 12/17/2023, SpO2 100%.  Results for orders placed or performed during the hospital encounter of 08/30/24 (from the past 24 hours)  Urinalysis, Routine w reflex microscopic -Urine, Clean Catch     Status: Abnormal   Collection Time: 08/30/24 11:29 PM  Result Value Ref Range   Color, Urine YELLOW YELLOW   APPearance HAZY (A) CLEAR    Specific Gravity, Urine 1.025 1.005 - 1.030   pH 5.0 5.0 - 8.0   Glucose, UA 50 (A) NEGATIVE mg/dL   Hgb urine dipstick SMALL (A) NEGATIVE   Bilirubin Urine NEGATIVE NEGATIVE   Ketones, ur NEGATIVE NEGATIVE mg/dL   Protein, ur NEGATIVE NEGATIVE mg/dL   Nitrite NEGATIVE NEGATIVE   Leukocytes,Ua NEGATIVE NEGATIVE   RBC / HPF 0-5 0 - 5 RBC/hpf   WBC, UA 0-5 0 - 5 WBC/hpf   Bacteria, UA RARE (A) NONE SEEN   Squamous Epithelial / HPF 6-10 0 - 5 /HPF   Mucus PRESENT    Ca Oxalate Crys, UA PRESENT   Protein / creatinine ratio, urine     Status: None   Collection Time: 08/30/24 11:30 PM  Result Value Ref Range   Creatinine, Urine 198 mg/dL   Total Protein, Urine 17 mg/dL   Protein Creatinine Ratio 0.09 0.00 - 0.15 mg/mg[Cre]  CBC with Differential/Platelet     Status: Abnormal   Collection Time: 08/30/24 11:40 PM  Result Value Ref Range   WBC 16.0 (H) 4.0 - 10.5 K/uL   RBC 4.35 3.87 - 5.11 MIL/uL   Hemoglobin 13.3 12.0 - 15.0 g/dL   HCT 60.9 63.9 - 53.9 %   MCV 89.7 80.0 - 100.0 fL   MCH 30.6 26.0 - 34.0 pg   MCHC 34.1 30.0 - 36.0 g/dL   RDW 86.7 88.4 - 84.4 %   Platelets 417 (H) 150 - 400 K/uL   nRBC 0.0 0.0 - 0.2 %   Neutrophils Relative % 62 %   Neutro Abs 9.9 (H) 1.7 - 7.7 K/uL   Lymphocytes Relative 29 %   Lymphs Abs 4.7 (H) 0.7 - 4.0 K/uL   Monocytes Relative 7 %   Monocytes Absolute 1.2 (H) 0.1 - 1.0 K/uL   Eosinophils Relative 1 %   Eosinophils Absolute 0.1 0.0 - 0.5 K/uL   Basophils Relative 0 %   Basophils Absolute 0.0 0.0 - 0.1 K/uL   Immature Granulocytes 1 %   Abs Immature Granulocytes 0.10 (H) 0.00 - 0.07 K/uL  Comprehensive metabolic panel     Status: Abnormal   Collection Time: 08/30/24 11:40 PM  Result Value Ref Range   Sodium 136 135 - 145 mmol/L   Potassium 3.8 3.5 - 5.1 mmol/L   Chloride 110 98 - 111 mmol/L   CO2 19 (L) 22 - 32 mmol/L   Glucose, Bld 123 (H) 70 - 99 mg/dL   BUN 10 6 - 20 mg/dL   Creatinine, Ser 9.35 0.44 - 1.00 mg/dL   Calcium 9.5  8.9 - 89.6 mg/dL   Total Protein 6.8 6.5 - 8.1 g/dL  Albumin 2.7 (L) 3.5 - 5.0 g/dL   AST 17 15 - 41 U/L   ALT 21 0 - 44 U/L   Alkaline Phosphatase 142 (H) 38 - 126 U/L   Total Bilirubin 0.2 0.0 - 1.2 mg/dL   GFR, Estimated >39 >39 mL/min   Anion gap 7 5 - 15    Physical Exam Constitutional:      Appearance: Normal appearance. She is obese.  Cardiovascular:     Rate and Rhythm: Normal rate and regular rhythm.     Pulses: Normal pulses.     Heart sounds: Normal heart sounds.  Pulmonary:     Effort: Pulmonary effort is normal.     Breath sounds: Normal breath sounds.  Abdominal:     General: Bowel sounds are normal.  Skin:    General: Skin is warm and dry.  Neurological:     Mental Status: She is alert.      FHR: bpm, Mod Var, -Decels, +Accels UC:  ED Course  Assessment: VIVYAN BIGGERS is a 38 y.o. female G66P0101 with IUP at [redacted]w[redacted]d by LMP presenting for elevated blood pressure and distal extremity tingling.   Blood pressure overall better-controlled than prior visit, has been mildly elevated but low concern for pre-eclampsia considering normal Pr:Cr, CMP and platelets. WBC is elevated at 16 with neutrophilia, but has been mildly elevated over past 3 months only slightly increased from 4 days ago, no fever or other symptoms and unremarkable UA are reassuring against infectious process, likely reactive leukocytosis in the setting of pregnancy.   Plan: - Discharge to home in stable condition. - Continue nifedipine  BID, home BP monitoring. - Continue supervision of high risk pregnancy as scheduled. - F/u 11/20.   Nunzio DELENA Cena Verma Medical Student 08/31/2024 1:05 AM

## 2024-09-03 ENCOUNTER — Other Ambulatory Visit (HOSPITAL_COMMUNITY)
Admission: RE | Admit: 2024-09-03 | Discharge: 2024-09-03 | Disposition: A | Discharge status: Home / Self Care | Source: Ambulatory Visit | Attending: Family Medicine | Admitting: Family Medicine

## 2024-09-03 ENCOUNTER — Ambulatory Visit: Admitting: Family Medicine

## 2024-09-03 ENCOUNTER — Encounter (HOSPITAL_COMMUNITY): Payer: Self-pay | Admitting: *Deleted

## 2024-09-03 ENCOUNTER — Ambulatory Visit: Attending: Obstetrics and Gynecology | Admitting: Obstetrics and Gynecology

## 2024-09-03 ENCOUNTER — Ambulatory Visit

## 2024-09-03 ENCOUNTER — Telehealth (HOSPITAL_COMMUNITY): Payer: Self-pay | Admitting: *Deleted

## 2024-09-03 VITALS — BP 143/92 | HR 87 | Wt 301.0 lb

## 2024-09-03 DIAGNOSIS — Z6841 Body Mass Index (BMI) 40.0 and over, adult: Secondary | ICD-10-CM

## 2024-09-03 DIAGNOSIS — O09523 Supervision of elderly multigravida, third trimester: Secondary | ICD-10-CM

## 2024-09-03 DIAGNOSIS — Z3A36 36 weeks gestation of pregnancy: Secondary | ICD-10-CM | POA: Diagnosis not present

## 2024-09-03 DIAGNOSIS — O09213 Supervision of pregnancy with history of pre-term labor, third trimester: Secondary | ICD-10-CM | POA: Insufficient documentation

## 2024-09-03 DIAGNOSIS — O99213 Obesity complicating pregnancy, third trimester: Secondary | ICD-10-CM | POA: Insufficient documentation

## 2024-09-03 DIAGNOSIS — O99210 Obesity complicating pregnancy, unspecified trimester: Secondary | ICD-10-CM

## 2024-09-03 DIAGNOSIS — E669 Obesity, unspecified: Secondary | ICD-10-CM | POA: Diagnosis not present

## 2024-09-03 DIAGNOSIS — O09529 Supervision of elderly multigravida, unspecified trimester: Secondary | ICD-10-CM

## 2024-09-03 DIAGNOSIS — O10013 Pre-existing essential hypertension complicating pregnancy, third trimester: Secondary | ICD-10-CM | POA: Diagnosis not present

## 2024-09-03 DIAGNOSIS — O0990 Supervision of high risk pregnancy, unspecified, unspecified trimester: Secondary | ICD-10-CM | POA: Insufficient documentation

## 2024-09-03 DIAGNOSIS — O10919 Unspecified pre-existing hypertension complicating pregnancy, unspecified trimester: Secondary | ICD-10-CM | POA: Diagnosis not present

## 2024-09-03 DIAGNOSIS — R7303 Prediabetes: Secondary | ICD-10-CM | POA: Diagnosis not present

## 2024-09-03 DIAGNOSIS — E6689 Other obesity not elsewhere classified: Secondary | ICD-10-CM

## 2024-09-03 NOTE — Progress Notes (Signed)
 rou

## 2024-09-03 NOTE — Progress Notes (Signed)
   PRENATAL VISIT NOTE  Subjective:  Carly Murray is a 38 y.o. G2P0101 at [redacted]w[redacted]d being seen today for ongoing prenatal care.  She is currently monitored for the following issues for this high-risk pregnancy and has Supervision of high risk pregnancy, antepartum; UTI in pregnancy; Obesity in pregnancy; Antepartum multigravida of advanced maternal age; Prediabetes; Chronic hypertension affecting pregnancy; History of preterm delivery, currently pregnant; BMI 45.0-49.9, adult (HCC); and Moderate dysplasia of cervix (CIN II) on their problem list.  Patient reports no complaints.  Contractions: Not present. Vag. Bleeding: None.  Movement: Present. Denies leaking of fluid.   The following portions of the patient's history were reviewed and updated as appropriate: allergies, current medications, past family history, past medical history, past social history, past surgical history and problem list.   Objective:   Vitals:   09/03/24 0824 09/03/24 0828  BP: (!) 146/96 (!) 143/92  Pulse: 90 87  Weight: (!) 301 lb (136.5 kg)     Fetal Status:  Fetal Heart Rate (bpm): 147   Movement: Present    General: Alert, oriented and cooperative. Patient is in no acute distress.  Skin: Skin is warm and dry. No rash noted.   Cardiovascular: Normal heart rate noted  Respiratory: Normal respiratory effort, no problems with respiration noted  Abdomen: Soft, gravid, appropriate for gestational age.  Pain/Pressure: Present     Pelvic: Cervical exam deferred        Extremities: Normal range of motion.  Edema: Trace  Mental Status: Normal mood and affect. Normal behavior. Normal judgment and thought content.      03/26/2024    9:33 AM  Depression screen PHQ 2/9  Decreased Interest 0  Down, Depressed, Hopeless 0  PHQ - 2 Score 0  Altered sleeping 0  Tired, decreased energy 2  Change in appetite 0  Feeling bad or failure about yourself  0  Trouble concentrating 0  Moving slowly or fidgety/restless 0   Suicidal thoughts 0  PHQ-9 Score 2      Data saved with a previous flowsheet row definition        03/26/2024    9:34 AM  GAD 7 : Generalized Anxiety Score  Nervous, Anxious, on Edge 2  Control/stop worrying 0  Worry too much - different things 1  Trouble relaxing 0  Restless 0  Easily annoyed or irritable 0  Afraid - awful might happen 0  Total GAD 7 Score 3    Assessment and Plan:  Pregnancy: G2P0101 at [redacted]w[redacted]d 1. [redacted] weeks gestation of pregnancy (Primary)  2. Supervision of high risk pregnancy, antepartum FHT normal  3. Chronic hypertension affecting pregnancy Increasing doses of medicine. MFM recommend delivery at 37-38 weeks. Scheduled for 11/25.  4. Prediabetes   5. BMI 45.0-49.9, adult (HCC)   Preterm labor symptoms and general obstetric precautions including but not limited to vaginal bleeding, contractions, leaking of fluid and fetal movement were reviewed in detail with the patient. Please refer to After Visit Summary for other counseling recommendations.   No follow-ups on file.  Future Appointments  Date Time Provider Department Center  09/03/2024  2:45 PM [facility] PROVIDER 1 [facility]  09/03/2024  3:00 PM [facility] US3 [facility]  09/08/2024  8:15 AM Synthia Raisin, CNM [facility] None  09/08/2024  2:45 PM [facility] PROVIDER 1 [facility]  09/08/2024  3:00 PM [facility] US1 [facility]    Madgie Dhaliwal J Gregrey Bloyd, DO

## 2024-09-03 NOTE — Telephone Encounter (Signed)
 Preadmission screen

## 2024-09-03 NOTE — Progress Notes (Signed)
 Maternal-Fetal Medicine Consultation  Name: Carly Murray  MRN: [id number]  GA: [id number] [redacted]w[redacted]d   Patient is here for antenatal testing.  She has a diagnosis of chronic hypertension.  Patient takes nifedipine  XL 30 mg twice daily. Blood pressures at our office where 160/92, 143/93 and 140/79 mmHg (final). She does not have symptoms of severe headache or visual disturbances or right upper quadrant pain or vaginal bleeding.  Ultrasound Normal amniotic fluid.  Cephalic presentation.  Antenatal testing is reassuring.  BPP 8/8.  I discussed the importance of good blood glucose control.  I counseled the patient on the parameters of severe range hypertension.  Patient reports she had severe range hypertension at home blood pressure monitoring that prompted her to go to the MAU.  She was evaluated at the MAU 3 days ago.  Labs including liver enzymes and platelets were within normal range.  I discussed hypertension and timing of delivery.  Patient will be undergoing induction of labor on 09/08/2024. I discussed the importance of checking blood pressures regularly to identify severe range hypertension.  If she has severe range hypertension or any of the symptoms mentioned above, she should be delivered.  I instructed the patient to come to the hospital if she reports severe headache or visual disturbances or right upper quadrant pain, or if blood pressures are in the severe range.  Recommendations - Patient is scheduled for induction of labor on 09/08/2024.     Consultation including face-to-face (more than 50%) counseling 30 minutes.

## 2024-09-04 ENCOUNTER — Ambulatory Visit: Payer: Self-pay | Admitting: Family Medicine

## 2024-09-04 DIAGNOSIS — O99820 Streptococcus B carrier state complicating pregnancy: Secondary | ICD-10-CM

## 2024-09-04 DIAGNOSIS — O0990 Supervision of high risk pregnancy, unspecified, unspecified trimester: Secondary | ICD-10-CM

## 2024-09-04 LAB — GC/CHLAMYDIA PROBE AMP (~~LOC~~) NOT AT ARMC
Chlamydia: NEGATIVE
Comment: NEGATIVE
Comment: NORMAL
Neisseria Gonorrhea: NEGATIVE

## 2024-09-05 LAB — STREP GP B NAA: Strep Gp B NAA: POSITIVE — AB

## 2024-09-07 ENCOUNTER — Encounter (HOSPITAL_COMMUNITY): Payer: Self-pay | Admitting: Family Medicine

## 2024-09-07 DIAGNOSIS — O99820 Streptococcus B carrier state complicating pregnancy: Secondary | ICD-10-CM | POA: Insufficient documentation

## 2024-09-07 DIAGNOSIS — Z3490 Encounter for supervision of normal pregnancy, unspecified, unspecified trimester: Principal | ICD-10-CM | POA: Diagnosis present

## 2024-09-07 NOTE — H&P (Shared)
 OBSTETRIC ADMISSION HISTORY AND PHYSICAL  Carly Murray is 38 y.o. G2P0101 with IUP at [redacted]w[redacted]d 09/29/2024, by Ultrasound presenting for IOL. She received her prenatal care at [facility]  ROS (+) FM (-) ctx, VB, LOF. HA, visual changes, CP, SOB, RUQ pain, peripheral edema.  Prenatal History/Complications NURSING  PROVIDER  Office Location [place] Dating by U/S at 7 wks  [facility] [address] Model Traditional Anatomy U/S 7/31: Normal 35th%ile, Anterior Placenta. No anomalies  Initiated care at  10wks                 Language  English               LAB RESULTS   Support Person FOB Genetics NIPS: LR AFP:       NT/IT (FT only)        Carrier Screen Horizon: neg  Rhogam  A/Positive/-- (05/20 1406) A1C/GTT Early HgbA1C: 5.9 Third trimester 2 hr GTT: neg  Flu Vaccine Declines      TDaP Vaccine   Blood Type A/Positive/-- (05/20 1406)  RSV Vaccine   Antibody Negative (05/20 1406)  COVID Vaccine X2  Rubella 6.94 (05/20 1406)  Feeding Plan breast RPR Non Reactive (09/19 0843)  Contraception undecided HBsAg Negative (05/20 1406)  Circumcision yes HIV Non Reactive (09/19 0843)  Pediatrician  N/A HCVAb Non Reactive (05/20 1406)  Prenatal Classes Declines      BTL Consent   Pap       Diagnosis  Date Value Ref Range Status  03/26/2024     Final    - Negative for intraepithelial lesion or malignancy (NILM)    BTL Pre-payment   GC/CT Initial:   36wks:    VBAC Consent   GBS  positive  BRx Optimized? [ ]  yes   [ ]  no      DME Rx [ ]  BP cuff [ ]  Weight Scale Waterbirth  [ ]  Class [ ]  Consent [ ]  CNM visit  PHQ9 & GAD7 [  ] new OB [  ] 28 weeks  [  ] 36 weeks Induction  [ ]  Orders Entered [ ] Foley Y/N   OB History  Gravida Para Term Preterm AB Living  2 1  1  1   SAB IAB Ectopic Multiple Live Births      1    # Outcome Date GA Lbr Len/2nd Weight Sex Type Anes PTL Lv  2 Current           1 Preterm 05/19/08 [redacted]w[redacted]d  1077 g M   Y LIV     Complications: Preterm premature rupture of membranes   Patient  Active Problem List   Diagnosis Date Noted   GBS (group B Streptococcus carrier), +RV culture, currently pregnant 09/07/2024   Encounter for induction of labor 09/07/2024   Chronic hypertension affecting pregnancy 07/16/2024   History of preterm delivery, currently pregnant 07/16/2024   BMI 45.0-49.9, adult (HCC) 07/16/2024   Prediabetes 04/23/2024   Obesity in pregnancy 03/26/2024   Antepartum multigravida of advanced maternal age 46/09/2024   UTI in pregnancy 03/10/2024   Supervision of high risk pregnancy, antepartum 03/03/2024   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 30 tablet 2    Prenatal Vit-Fe Fumarate-FA (PRENATAL VITAMINS PO) Take 1 capsule by mouth daily.  Past Medical History: Past Medical History:  Diagnosis Date   Obesity    Recurrent infection of skin    Past Surgical History: Past Surgical History:  Procedure Laterality Date   KNEE SURGERY     Social History Social History   Socioeconomic History   Marital status: Single    Spouse name: Not on file   Number of children: Not on file   Years of education: Not on file   Highest education level: Not on file  Occupational History   Not on file  Tobacco Use   Smoking status: Never   Smokeless tobacco: Never  Vaping Use   Vaping status: Never Used  Substance and Sexual Activity   Alcohol use: Not Currently    Comment: occ   Drug use: Never   Sexual activity: Yes    Birth control/protection: None  Other Topics Concern   Not on file  Social History Narrative   Not on file   Social Drivers of Health   Financial Resource Strain: Patient Declined (12/06/2023)   Received from [facility]   Overall Financial Resource Strain (CARDIA)    Difficulty of Paying Living Expenses: Patient declined  Food  Insecurity: No Food Insecurity (09/08/2024)   Hunger Vital Sign    Worried About Running Out of Food in the Last Year: Never true    Ran Out of Food in the Last Year: Never true  Transportation Needs: No Transportation Needs (09/08/2024)   PRAPARE - Administrator, Civil Service (Medical): No    Lack of Transportation (Non-Medical): No  Physical Activity: Insufficiently Active (07/08/2023)   Received from [facility]   Exercise Vital Sign    On average, how many days per week do you engage in moderate to strenuous exercise (like a brisk walk)?: 3 days    On average, how many minutes do you engage in exercise at this level?: 20 min  Stress: Stress Concern Present (07/08/2023)   Received from [facility] - Occupational Stress Questionnaire    Feeling of Stress : To some extent  Social Connections: Moderately Isolated (09/08/2024)   Social Connection and Isolation Panel    Frequency of Communication with Friends and Family: More than three times a week    Frequency of Social Gatherings with Friends and Family: More than three times a week    Attends Religious Services: Never    Database Administrator or Organizations: No    Attends Engineer, Structural: Never    Marital Status: Living with partner   Family History: Family History  Problem Relation Age of Onset   Diabetes Mother    COPD Mother    Hypertension Mother    Lupus Mother    Hepatitis C Father     Review of Systems  All systems reviewed and negative except as stated in HPI   PHYSICAL EXAM Blood pressure 136/70, pulse 84, temperature 98.4 F (36.9 C), temperature source Oral, resp. rate 18, height 5' 2 (1.575 m), weight (!) 137.9 kg, last menstrual period 12/17/2023. General appearance: alert and cooperative Lungs: respirations nonlabored Heart: regular rate Abdomen: gravid  Fetal monitoringBaseline: 130 bpm, Variability: Good {> 6 bpm),  Accelerations: Reactive, and Decelerations: Absent Uterine activity: rare  Dilation: Closed Effacement (%): Thick Exam by:: EMERSON Dears Presentation: cephalic  Prenatal labs: ABO, Rh: --/--/PENDING (11/25 0140) Antibody: PENDING (11/25 0140) Rubella: 6.94 (05/20 1406) RPR: Non Reactive (09/19 0843)  HBsAg: Negative (05/20  1406)  HIV: Non Reactive (09/19 0843)   Lab Results  Component Value Date   GBS Positive (A) 09/03/2024   Anatomy US : Normal 35th%ile, Anterior Placenta. No anomalies   Immunization History  Administered Date(s) Administered   PPD Test 08/28/2021   Tdap 05/12/2018, 07/02/2024    Results for orders placed or performed during the hospital encounter of 09/08/24 (from the past 24 hours)  CBC   Collection Time: 09/08/24  1:40 AM  Result Value Ref Range   WBC 15.6 (H) 4.0 - 10.5 K/uL   RBC 4.55 3.87 - 5.11 MIL/uL   Hemoglobin 14.1 12.0 - 15.0 g/dL   HCT 59.4 63.9 - 53.9 %   MCV 89.0 80.0 - 100.0 fL   MCH 31.0 26.0 - 34.0 pg   MCHC 34.8 30.0 - 36.0 g/dL   RDW 86.6 88.4 - 84.4 %   Platelets 402 (H) 150 - 400 K/uL   nRBC 0.0 0.0 - 0.2 %  Type and screen   Collection Time: 09/08/24  1:40 AM  Result Value Ref Range   ABO/RH(D) PENDING    Antibody Screen PENDING    Sample Expiration      09/11/2024,2359 Performed at [facility], [address], [place], [place] [address]     Patient Active Problem List   Diagnosis Date Noted   GBS (group B Streptococcus carrier), +RV culture, currently pregnant 09/07/2024   Encounter for induction of labor 09/07/2024   Chronic hypertension affecting pregnancy 07/16/2024   History of preterm delivery, currently pregnant 07/16/2024   BMI 45.0-49.9, adult (HCC) 07/16/2024   Prediabetes 04/23/2024   Obesity in pregnancy 03/26/2024   Antepartum multigravida of advanced maternal age 79/09/2024   UTI in pregnancy 03/10/2024   Supervision of high risk pregnancy, antepartum 03/03/2024   Moderate dysplasia of cervix  (CIN II) 01/12/2015    Prenatal Transfer Tool  Maternal Diabetes: No Genetic Screening: Normal Maternal Ultrasounds/Referrals: Normal Fetal Ultrasounds or other Referrals:  None Maternal Substance Abuse:  No Significant Maternal Medications:  None Significant Maternal Lab Results: Group B Strep positive Number of Prenatal Visits:greater than 3 verified prenatal visits Maternal Vaccinations:TDap Other Comments:  None   ASSESSMENT & PLAN Carly Murray is 38 y.o. G2P0101 with IUP at [redacted]w[redacted]d 09/29/2024, by Ultrasound admitted for IOL iso cHTN.  Sono at 34+2: normal anatomy, cephalic presentation, anterior placenta, EFW 2096g (13%)  #Labor: IOL, Dmiso now #Pain: Per patient preference, encourage ambulation #FWB: Cat I  #cHTN - continue nifedipine  30mg  daily - plan for Lasix  + K postpartum x5d  #prediabetes #obesity Passed GTT x2. Pregravid BMI 48. Previously on Wegovy  through [facility]. - BMI on admit 56  #GBS status:  positive #Feeding: Breastmilk  #Reproductive Life planning: Undecided #Circ:  yes   Barabara Maier, DO FM-OB Fellow [facility] [employer]

## 2024-09-08 ENCOUNTER — Encounter (HOSPITAL_COMMUNITY): Payer: Self-pay | Admitting: Family Medicine

## 2024-09-08 ENCOUNTER — Ambulatory Visit

## 2024-09-08 ENCOUNTER — Encounter

## 2024-09-08 ENCOUNTER — Inpatient Hospital Stay (HOSPITAL_COMMUNITY): Admitting: Anesthesiology

## 2024-09-08 ENCOUNTER — Other Ambulatory Visit: Payer: Self-pay

## 2024-09-08 ENCOUNTER — Inpatient Hospital Stay (HOSPITAL_COMMUNITY)
Admission: RE | Admit: 2024-09-08 | Discharge: 2024-09-12 | DRG: 788 | Disposition: A | Discharge status: Home / Self Care | Payer: Self-pay | Attending: Obstetrics & Gynecology | Admitting: Obstetrics & Gynecology

## 2024-09-08 ENCOUNTER — Inpatient Hospital Stay (HOSPITAL_COMMUNITY)

## 2024-09-08 DIAGNOSIS — O1092 Unspecified pre-existing hypertension complicating childbirth: Secondary | ICD-10-CM | POA: Diagnosis present

## 2024-09-08 DIAGNOSIS — M542 Cervicalgia: Secondary | ICD-10-CM | POA: Diagnosis not present

## 2024-09-08 DIAGNOSIS — R221 Localized swelling, mass and lump, neck: Secondary | ICD-10-CM | POA: Diagnosis not present

## 2024-09-08 DIAGNOSIS — R52 Pain, unspecified: Secondary | ICD-10-CM | POA: Diagnosis not present

## 2024-09-08 DIAGNOSIS — O34211 Maternal care for low transverse scar from previous cesarean delivery: Secondary | ICD-10-CM | POA: Diagnosis present

## 2024-09-08 DIAGNOSIS — R070 Pain in throat: Secondary | ICD-10-CM | POA: Diagnosis not present

## 2024-09-08 DIAGNOSIS — Z3A37 37 weeks gestation of pregnancy: Secondary | ICD-10-CM | POA: Diagnosis not present

## 2024-09-08 DIAGNOSIS — T884XXA Failed or difficult intubation, initial encounter: Secondary | ICD-10-CM | POA: Insufficient documentation

## 2024-09-08 DIAGNOSIS — O99210 Obesity complicating pregnancy, unspecified trimester: Secondary | ICD-10-CM | POA: Diagnosis present

## 2024-09-08 DIAGNOSIS — Z98891 History of uterine scar from previous surgery: Secondary | ICD-10-CM

## 2024-09-08 DIAGNOSIS — R7303 Prediabetes: Secondary | ICD-10-CM | POA: Diagnosis present

## 2024-09-08 DIAGNOSIS — Z79899 Other long term (current) drug therapy: Secondary | ICD-10-CM

## 2024-09-08 DIAGNOSIS — O99820 Streptococcus B carrier state complicating pregnancy: Secondary | ICD-10-CM

## 2024-09-08 DIAGNOSIS — Z3A36 36 weeks gestation of pregnancy: Secondary | ICD-10-CM | POA: Diagnosis not present

## 2024-09-08 DIAGNOSIS — O99214 Obesity complicating childbirth: Secondary | ICD-10-CM | POA: Diagnosis present

## 2024-09-08 DIAGNOSIS — O09529 Supervision of elderly multigravida, unspecified trimester: Secondary | ICD-10-CM

## 2024-09-08 DIAGNOSIS — Z833 Family history of diabetes mellitus: Secondary | ICD-10-CM | POA: Diagnosis not present

## 2024-09-08 DIAGNOSIS — O10919 Unspecified pre-existing hypertension complicating pregnancy, unspecified trimester: Secondary | ICD-10-CM | POA: Diagnosis present

## 2024-09-08 DIAGNOSIS — Z8249 Family history of ischemic heart disease and other diseases of the circulatory system: Secondary | ICD-10-CM

## 2024-09-08 DIAGNOSIS — O26893 Other specified pregnancy related conditions, third trimester: Secondary | ICD-10-CM | POA: Diagnosis present

## 2024-09-08 DIAGNOSIS — O99824 Streptococcus B carrier state complicating childbirth: Secondary | ICD-10-CM | POA: Diagnosis present

## 2024-09-08 DIAGNOSIS — O1002 Pre-existing essential hypertension complicating childbirth: Secondary | ICD-10-CM | POA: Diagnosis not present

## 2024-09-08 DIAGNOSIS — Z3490 Encounter for supervision of normal pregnancy, unspecified, unspecified trimester: Principal | ICD-10-CM | POA: Diagnosis present

## 2024-09-08 DIAGNOSIS — O0990 Supervision of high risk pregnancy, unspecified, unspecified trimester: Secondary | ICD-10-CM

## 2024-09-08 LAB — COMPREHENSIVE METABOLIC PANEL WITH GFR
ALT: 21 U/L (ref 0–44)
AST: 20 U/L (ref 15–41)
Albumin: 2.9 g/dL — ABNORMAL LOW (ref 3.5–5.0)
Alkaline Phosphatase: 150 U/L — ABNORMAL HIGH (ref 38–126)
Anion gap: 11 (ref 5–15)
BUN: 9 mg/dL (ref 6–20)
CO2: 18 mmol/L — ABNORMAL LOW (ref 22–32)
Calcium: 9.1 mg/dL (ref 8.9–10.3)
Chloride: 108 mmol/L (ref 98–111)
Creatinine, Ser: 0.56 mg/dL (ref 0.44–1.00)
GFR, Estimated: >60 mL/min (ref >60)
Glucose, Bld: 129 mg/dL — ABNORMAL HIGH (ref 70–99)
Potassium: 3.8 mmol/L (ref 3.5–5.1)
Sodium: 137 mmol/L (ref 135–145)
Total Bilirubin: 0.2 mg/dL (ref 0.0–1.2)
Total Protein: 6.9 g/dL (ref 6.5–8.1)

## 2024-09-08 LAB — CBC
HCT: 40.5 % (ref 36.0–46.0)
HCT: 38 % (ref 36.0–46.0)
Hemoglobin: 14.1 g/dL (ref 12.0–15.0)
Hemoglobin: 12.8 g/dL (ref 12.0–15.0)
MCH: 31 pg (ref 26.0–34.0)
MCH: 30.8 pg (ref 26.0–34.0)
MCHC: 34.8 g/dL (ref 30.0–36.0)
MCHC: 33.7 g/dL (ref 30.0–36.0)
MCV: 89 fL (ref 80.0–100.0)
MCV: 91.3 fL (ref 80.0–100.0)
Platelets: 402 K/uL — ABNORMAL HIGH (ref 150–400)
Platelets: 357 K/uL (ref 150–400)
RBC: 4.55 MIL/uL (ref 3.87–5.11)
RBC: 4.16 MIL/uL (ref 3.87–5.11)
RDW: 13.3 % (ref 11.5–15.5)
RDW: 13.5 % (ref 11.5–15.5)
WBC: 15.6 K/uL — ABNORMAL HIGH (ref 4.0–10.5)
WBC: 15.4 K/uL — ABNORMAL HIGH (ref 4.0–10.5)
nRBC: 0 % (ref 0.0–0.2)
nRBC: 0 % (ref 0.0–0.2)

## 2024-09-08 LAB — PROTEIN / CREATININE RATIO, URINE
Creatinine, Urine: 109 mg/dL
Protein Creatinine Ratio: 0.12 mg/mg{creat} (ref 0.00–0.15)
Total Protein, Urine: 13 mg/dL

## 2024-09-08 LAB — TYPE AND SCREEN
ABO/RH(D): A POS
Antibody Screen: NEGATIVE

## 2024-09-08 LAB — SYPHILIS: RPR W/REFLEX TO RPR TITER AND TREPONEMAL ANTIBODIES, TRADITIONAL SCREENING AND DIAGNOSIS ALGORITHM: RPR Ser Ql: NONREACTIVE

## 2024-09-08 MED ORDER — ACETAMINOPHEN 325 MG PO TABS: 650.0000 mg | ORAL_TABLET | ORAL | Status: DC | PRN

## 2024-09-08 MED ORDER — MISOPROSTOL 25 MCG QUARTER TABLET
25.0000 ug | ORAL_TABLET | Freq: Once | ORAL | Status: AC
  Administered 2024-09-08: 25 ug via VAGINAL
  Filled 2024-09-08: qty 1

## 2024-09-08 MED ORDER — LACTATED RINGERS IV SOLN: INTRAVENOUS | Status: DC

## 2024-09-08 MED ORDER — FENTANYL CITRATE (PF) 100 MCG/2ML IJ SOLN
100.0000 ug | INTRAMUSCULAR | Status: DC | PRN
  Administered 2024-09-08 – 2024-09-09 (×7): 100 ug via INTRAVENOUS
  Filled 2024-09-08 (×9): qty 2

## 2024-09-08 MED ORDER — OXYTOCIN-SODIUM CHLORIDE 30-0.9 UT/500ML-% IV SOLN
1.0000 m[IU]/min | INTRAVENOUS | Status: DC
  Administered 2024-09-08: 1 m[IU]/min via INTRAVENOUS
  Administered 2024-09-08: 2 m[IU]/min via INTRAVENOUS

## 2024-09-08 MED ORDER — LACTATED RINGERS IV SOLN
500.0000 mL | INTRAVENOUS | Status: DC | PRN
  Administered 2024-09-08 – 2024-09-09 (×3): 500 mL via INTRAVENOUS

## 2024-09-08 MED ORDER — MISOPROSTOL 50MCG HALF TABLET
50.0000 ug | ORAL_TABLET | ORAL | Status: DC | PRN
  Filled 2024-09-08: qty 1

## 2024-09-08 MED ORDER — PHENYLEPHRINE 80 MCG/ML (10ML) SYRINGE FOR IV PUSH (FOR BLOOD PRESSURE SUPPORT)
80.0000 ug | PREFILLED_SYRINGE | INTRAVENOUS | Status: DC | PRN
Start: 2024-09-08 — End: 2024-09-09

## 2024-09-08 MED ORDER — SODIUM CHLORIDE 0.9 % IV SOLN
5.0000 10*6.[IU] | Freq: Once | INTRAVENOUS | Status: AC
  Administered 2024-09-08: 5 10*6.[IU] via INTRAVENOUS
  Filled 2024-09-08: qty 5

## 2024-09-08 MED ORDER — SOD CITRATE-CITRIC ACID 500-334 MG/5ML PO SOLN
30.0000 mL | ORAL | Status: DC | PRN
  Filled 2024-09-08: qty 30

## 2024-09-08 MED ORDER — PENICILLIN G POT IN DEXTROSE 60000 UNIT/ML IV SOLN
3.0000 10*6.[IU] | INTRAVENOUS | Status: DC
  Administered 2024-09-08 – 2024-09-09 (×3): 3 10*6.[IU] via INTRAVENOUS
  Filled 2024-09-08 (×5): qty 50

## 2024-09-08 MED ORDER — TERBUTALINE SULFATE 1 MG/ML IJ SOLN: 0.2500 mg | Freq: Once | INTRAMUSCULAR | Status: DC | PRN

## 2024-09-08 MED ORDER — LIDOCAINE HCL (PF) 1 % IJ SOLN: 30.0000 mL | INTRAMUSCULAR | Status: DC | PRN

## 2024-09-08 MED ORDER — OXYCODONE-ACETAMINOPHEN 5-325 MG PO TABS: 1.0000 | ORAL_TABLET | ORAL | Status: DC | PRN

## 2024-09-08 MED ORDER — DIPHENHYDRAMINE HCL 50 MG/ML IJ SOLN: 12.5000 mg | INTRAMUSCULAR | Status: DC | PRN

## 2024-09-08 MED ORDER — FENTANYL CITRATE (PF) 100 MCG/2ML IJ SOLN
INTRAMUSCULAR | Status: DC | PRN
  Administered 2024-09-08: 100 ug via INTRAVENOUS

## 2024-09-08 MED ORDER — TERBUTALINE SULFATE 1 MG/ML IJ SOLN
0.2500 mg | Freq: Once | INTRAMUSCULAR | Status: AC | PRN
  Administered 2024-09-09: 0.25 mg via SUBCUTANEOUS
  Filled 2024-09-08: qty 1

## 2024-09-08 MED ORDER — BUPIVACAINE HCL (PF) 0.25 % IJ SOLN
INTRAMUSCULAR | Status: DC | PRN
  Administered 2024-09-08: 8 mL via EPIDURAL
  Administered 2024-09-08: 10 mL via EPIDURAL

## 2024-09-08 MED ORDER — LACTATED RINGERS IV SOLN
500.0000 mL | Freq: Once | INTRAVENOUS | Status: AC
  Administered 2024-09-08: 500 mL via INTRAVENOUS

## 2024-09-08 MED ORDER — OXYTOCIN BOLUS FROM INFUSION: 333.0000 mL | Freq: Once | INTRAVENOUS | Status: DC

## 2024-09-08 MED ORDER — LIDOCAINE HCL (PF) 1 % IJ SOLN
INTRAMUSCULAR | Status: DC | PRN
  Administered 2024-09-08: 8 mL via EPIDURAL

## 2024-09-08 MED ORDER — OXYTOCIN-SODIUM CHLORIDE 30-0.9 UT/500ML-% IV SOLN
2.5000 [IU]/h | INTRAVENOUS | Status: DC
  Filled 2024-09-08: qty 500

## 2024-09-08 MED ORDER — PHENYLEPHRINE 80 MCG/ML (10ML) SYRINGE FOR IV PUSH (FOR BLOOD PRESSURE SUPPORT): 80.0000 ug | PREFILLED_SYRINGE | INTRAVENOUS | Status: DC | PRN

## 2024-09-08 MED ORDER — FENTANYL-BUPIVACAINE-NACL 0.5-0.125-0.9 MG/250ML-% EP SOLN
12.0000 mL/h | EPIDURAL | Status: DC | PRN
  Administered 2024-09-08 – 2024-09-09 (×2): 12 mL/h via EPIDURAL
  Filled 2024-09-08 (×2): qty 250

## 2024-09-08 MED ORDER — ONDANSETRON HCL 4 MG/2ML IJ SOLN
4.0000 mg | Freq: Four times a day (QID) | INTRAMUSCULAR | Status: DC | PRN
  Administered 2024-09-08: 4 mg via INTRAVENOUS
  Filled 2024-09-08: qty 2

## 2024-09-08 MED ORDER — MISOPROSTOL 50MCG HALF TABLET
50.0000 ug | ORAL_TABLET | Freq: Once | ORAL | Status: AC
  Administered 2024-09-08: 50 ug via ORAL
  Filled 2024-09-08: qty 1

## 2024-09-08 MED ORDER — NIFEDIPINE ER OSMOTIC RELEASE 30 MG PO TB24
30.0000 mg | ORAL_TABLET | Freq: Every morning | ORAL | Status: DC
  Administered 2024-09-08: 30 mg via ORAL
  Filled 2024-09-08: qty 1

## 2024-09-08 MED ORDER — EPHEDRINE 5 MG/ML INJ: 10.0000 mg | INTRAVENOUS | Status: DC | PRN

## 2024-09-08 MED ORDER — OXYCODONE-ACETAMINOPHEN 5-325 MG PO TABS: 2.0000 | ORAL_TABLET | ORAL | Status: DC | PRN

## 2024-09-08 MED ORDER — TERBUTALINE SULFATE 1 MG/ML IJ SOLN
0.2500 mg | Freq: Once | INTRAMUSCULAR | Status: AC | PRN
  Administered 2024-09-08: 0.25 mg via SUBCUTANEOUS

## 2024-09-08 MED ORDER — LACTATED RINGERS AMNIOINFUSION: INTRAVENOUS | Status: DC

## 2024-09-08 NOTE — Progress Notes (Signed)
 LABOR PROGRESS NOTE Pt rechecked at 0640 Patient comfortable without epidural.  SCE: 1/70-80/-3 FB placed  FHT: baseline 140, mod variability, +accels, -decels; overall category I. Toco: flat  A/P:  Start pit Start PCN   [employer], DO 7:16 AM

## 2024-09-08 NOTE — Anesthesia Procedure Notes (Addendum)
 Epidural Patient location during procedure: OB Start time: 09/08/2024 1:12 PM End time: 09/08/2024 2:00 PM  Staffing Anesthesiologist: Merla Almarie HERO, DO Performed: anesthesiologist   Preanesthetic Checklist Completed: patient identified, IV checked, risks and benefits discussed, monitors and equipment checked, pre-op evaluation and timeout performed  Epidural Patient position: sitting Prep: DuraPrep and site prepped and draped Patient monitoring: continuous pulse ox, blood pressure, heart rate and cardiac monitor Approach: midline Location: L4-L5 Injection technique: LOR air  Needle:  Needle insertion depth: 13.5 cm Needle type: Tuohy  Needle gauge: 17 G Needle length: 15 cm Needle insertion depth: 13.5 cm Catheter type: closed end flexible Catheter size: 19 Gauge Catheter at skin depth: 20 cm Test dose: negative  Assessment Sensory level: T8 Events: blood not aspirated, no cerebrospinal fluid, injection not painful, no injection resistance, no paresthesia and negative IV test  Additional Notes Patient identified. Risks/Benefits/Options discussed with patient including but not limited to bleeding, infection, nerve damage, paralysis, failed block, incomplete pain control, headache, blood pressure changes, nausea, vomiting, reactions to medication both or allergic, itching and postpartum back pain. Confirmed with bedside nurse the patient's most recent platelet count. Confirmed with patient that they are not currently taking any anticoagulation, have any bleeding history or any family history of bleeding disorders. Patient expressed understanding and wished to proceed. All questions were answered. Sterile technique was used throughout the entire procedure. Please see nursing notes for vital signs. Test dose was given through epidural catheter and negative prior to continuing to dose epidural or start infusion. Warning signs of high block given to the patient including  shortness of breath, tingling/numbness in hands, complete motor block, or any concerning symptoms with instructions to call for help. Patient was given instructions on fall risk and not to get out of bed. All questions and concerns addressed with instructions to call with any issues or inadequate analgesia.    VERY difficult placement, attempted at 3 levels- ultimately successful with long tuohy (15cm tuohy, LOR at 13.5cm) at what I would estimate to be L4-5, although unable to palpate any landmarks at all due to habitus. Pt tolerated procedure poorly- required 200mcg fentanyl  IV in increments in order to be comfortable, which did ultimately help with placement and positioning.   DPE for confirmation of LOR with 24G pencan, clear CSF. Reason for block:procedure for pain

## 2024-09-08 NOTE — Progress Notes (Signed)
 LABOR PROGRESS NOTE In to greet patient at beginning of shift  Returned to bedside at 2045 SCE: 4/70/-2  FHT: baseline 120, mod variability, +accels, variable x1 decels; overall category II. Toco: q4-5 min   A/P:  Start pitocin  1mU/min. Do not uptitrate. Monitor contraction pattern and fetal tolerance. Will reassess before increasing dose.  Barabara Maier, DO 9:04 PM

## 2024-09-08 NOTE — Progress Notes (Signed)
 Labor Progress Note Carly Murray is a 38 y.o. G2P0101 at [redacted]w[redacted]d presented for IOL for cHTN  S: Doing well.  O:  BP 136/82   Pulse 92   Temp 97.8 F (36.6 C) (Oral)   Resp 18   Ht 5' 2 (1.575 m)   Wt (!) 137.9 kg   LMP 12/17/2023 (Exact Date)   BMI 55.62 kg/m  EFM: 135/Moderate Variability/Accelerations (-),Decelerations (-)  CVE: Dilation: 1 Effacement (%): 70, 80 Presentation: Vertex (Ultrasound) Exam by:: EMERSON Maier MD   A&P: 38 y.o. G2P0101 [redacted]w[redacted]d  #Labor: Progressing well. Pitocin  started, currently on 62mu/min. Will keep uptitrating. EFM difficult, likely due to body habitus, will consider IUPC/FSE after AROM. Awaiting completed duration of PCN for GBS ppx prior to AROM.  #Pain: Per patient request, planning epidural #FWB: Category I #GBS positive > PCN #cHTN: Mild range BP's, PIH labs wnl. Continue to monitor   Akeria Hedstrom LITTIE Angles, MD 12:53 PM

## 2024-09-08 NOTE — Progress Notes (Signed)
 Labor Progress Note Carly Murray is a 38 y.o. G2P0101 at [redacted]w[redacted]d presented for IOL for cHTN  S: Doing well.  O:  BP (!) 121/58   Pulse 85   Temp 97.8 F (36.6 C) (Oral)   Resp 18   Ht 5' 2 (1.575 m)   Wt (!) 137.9 kg   LMP 12/17/2023 (Exact Date)   SpO2 100%   BMI 55.62 kg/m  EFM: 135/Moderate Variability/Accelerations (-),Decelerations (-)  CVE: Dilation: 3 Effacement (%): 60 Station: -3 Presentation: Vertex Exam by:: Alejandro Adcox MD   A&P: 38 y.o. G2P0101 [redacted]w[redacted]d  #Labor: Progressing well. AROM performed.  #Pain: Epidural in place. #FWB: Category I #GBS positive > PCN #cHTN: Has been normotensive since last review, PIH labs wnl. Continue to monitor   Carly Filip LITTIE Angles, MD 3:00 PM

## 2024-09-08 NOTE — Progress Notes (Signed)
 Labor Progress Note Carly Murray is a 38 y.o. G2P0101 at [redacted]w[redacted]d presented for IOL for cHTN  S: Patient reporting discomfort through contractions  O:  BP 139/81   Pulse 74   Temp 97.8 F (36.6 C) (Oral)   Resp 18   Ht 5' 2 (1.575 m)   Wt (!) 137.9 kg   LMP 12/17/2023 (Exact Date)   SpO2 100%   BMI 55.62 kg/m  EFM: 125/Moderate Variability/Accelerations (-),Decelerations (-)  CVE: Dilation: 4 Effacement (%): 70 Station: -2 Presentation: Vertex Exam by:: [place] RN   A&P: 38 y.o. G2P0101 [redacted]w[redacted]d  #Labor: Called to patient's room because she was having discomfort through contractions and there is difficulty tracing fetal heart rate and contractions.  Discussed FSE and IUPC with the patient and patient agreeable.  FSE and IUPC placed without difficulty. #Pain: Epidural in place. #FWB: Category I #GBS positive > PCN #cHTN: Has been normotensive since last review, PIH labs wnl. Continue to monitor   Norleen LULLA Rover, MD 4:38 PM

## 2024-09-08 NOTE — Anesthesia Preprocedure Evaluation (Signed)
 Anesthesia Evaluation  Patient identified by MRN, date of birth, ID band Patient awake    Reviewed: Allergy & Precautions, Patient's Chart, lab work & pertinent test results  Airway Mallampati: III  TM Distance: >3 FB Neck ROM: Full    Dental no notable dental hx.    Pulmonary neg pulmonary ROS   Pulmonary exam normal breath sounds clear to auscultation       Cardiovascular hypertension, Pt. on medications Normal cardiovascular exam Rhythm:Regular Rate:Normal     Neuro/Psych negative neurological ROS  negative psych ROS   GI/Hepatic negative GI ROS, Neg liver ROS,,,  Endo/Other    Class 4 obesity (BMI 56)  Renal/GU negative Renal ROS  negative genitourinary   Musculoskeletal negative musculoskeletal ROS (+)    Abdominal   Peds negative pediatric ROS (+)  Hematology negative hematology ROS (+) Hb 12.8, plt 357   Anesthesia Other Findings   Reproductive/Obstetrics (+) Pregnancy                              Anesthesia Physical Anesthesia Plan  ASA: 4  Anesthesia Plan: Epidural   Post-op Pain Management:    Induction:   PONV Risk Score and Plan: 2  Airway Management Planned: Natural Airway  Additional Equipment: None  Intra-op Plan:   Post-operative Plan:   Informed Consent: I have reviewed the patients History and Physical, chart, labs and discussed the procedure including the risks, benefits and alternatives for the proposed anesthesia with the patient or authorized representative who has indicated his/her understanding and acceptance.       Plan Discussed with:   Anesthesia Plan Comments:         Anesthesia Quick Evaluation

## 2024-09-09 ENCOUNTER — Encounter (HOSPITAL_COMMUNITY)
Admission: RE | Disposition: A | Discharge status: Home / Self Care | Payer: Self-pay | Source: Home / Self Care | Attending: Obstetrics & Gynecology

## 2024-09-09 ENCOUNTER — Encounter (HOSPITAL_COMMUNITY): Payer: Self-pay | Admitting: Family Medicine

## 2024-09-09 DIAGNOSIS — Z3A36 36 weeks gestation of pregnancy: Secondary | ICD-10-CM | POA: Diagnosis not present

## 2024-09-09 DIAGNOSIS — Z3A37 37 weeks gestation of pregnancy: Secondary | ICD-10-CM

## 2024-09-09 DIAGNOSIS — O1002 Pre-existing essential hypertension complicating childbirth: Secondary | ICD-10-CM | POA: Diagnosis not present

## 2024-09-09 DIAGNOSIS — O99820 Streptococcus B carrier state complicating pregnancy: Secondary | ICD-10-CM | POA: Diagnosis not present

## 2024-09-09 DIAGNOSIS — O99214 Obesity complicating childbirth: Secondary | ICD-10-CM | POA: Diagnosis not present

## 2024-09-09 LAB — CBC
HCT: 37 % (ref 36.0–46.0)
Hemoglobin: 12.7 g/dL (ref 12.0–15.0)
MCH: 30.9 pg (ref 26.0–34.0)
MCHC: 34.3 g/dL (ref 30.0–36.0)
MCV: 90 fL (ref 80.0–100.0)
Platelets: 350 K/uL (ref 150–400)
RBC: 4.11 MIL/uL (ref 3.87–5.11)
RDW: 13.4 % (ref 11.5–15.5)
WBC: 26.8 K/uL — ABNORMAL HIGH (ref 4.0–10.5)
nRBC: 0 % (ref 0.0–0.2)

## 2024-09-09 MED ORDER — KETOROLAC TROMETHAMINE 30 MG/ML IJ SOLN
30.0000 mg | Freq: Four times a day (QID) | INTRAMUSCULAR | Status: DC
  Administered 2024-09-09: 30 mg via INTRAVENOUS
  Filled 2024-09-09 (×2): qty 1

## 2024-09-09 MED ORDER — ONDANSETRON HCL 4 MG/2ML IJ SOLN
INTRAMUSCULAR | Status: AC
Start: 2024-09-09 — End: 2024-09-09
  Filled 2024-09-09: qty 2

## 2024-09-09 MED ORDER — MEASLES, MUMPS & RUBELLA VAC ~~LOC~~ SUSR: 0.5000 mL | Freq: Once | SUBCUTANEOUS | Status: DC

## 2024-09-09 MED ORDER — MENTHOL 3 MG MT LOZG
1.0000 | LOZENGE | OROMUCOSAL | Status: DC | PRN
  Administered 2024-09-09: 3 mg via ORAL
  Filled 2024-09-09: qty 9

## 2024-09-09 MED ORDER — BENZOCAINE 20 % MT AERO
INHALATION_SPRAY | Freq: Four times a day (QID) | OROMUCOSAL | Status: DC | PRN
Start: 2024-09-09 — End: 2024-09-12

## 2024-09-09 MED ORDER — DEXMEDETOMIDINE HCL IN NACL 400 MCG/100ML IV SOLN
INTRAVENOUS | Status: DC | PRN
  Administered 2024-09-09: 20 ug via INTRAVENOUS

## 2024-09-09 MED ORDER — INFLUENZA VIRUS VACC SPLIT PF (FLUZONE) 0.5 ML IM SUSY: 0.5000 mL | PREFILLED_SYRINGE | INTRAMUSCULAR | Status: DC

## 2024-09-09 MED ORDER — HYDROMORPHONE 1 MG/ML IV SOLN
INTRAVENOUS | Status: DC
  Administered 2024-09-09: 30 mg via INTRAVENOUS
  Filled 2024-09-09: qty 30

## 2024-09-09 MED ORDER — OXYTOCIN-SODIUM CHLORIDE 30-0.9 UT/500ML-% IV SOLN
INTRAVENOUS | Status: DC | PRN
  Administered 2024-09-09: 30 [IU] via INTRAVENOUS

## 2024-09-09 MED ORDER — PHENYLEPHRINE 80 MCG/ML (10ML) SYRINGE FOR IV PUSH (FOR BLOOD PRESSURE SUPPORT)
PREFILLED_SYRINGE | INTRAVENOUS | Status: DC | PRN
  Administered 2024-09-09 (×4): 160 ug via INTRAVENOUS

## 2024-09-09 MED ORDER — ACETAMINOPHEN 10 MG/ML IV SOLN
INTRAVENOUS | Status: DC | PRN
  Administered 2024-09-09: 1000 mg via INTRAVENOUS

## 2024-09-09 MED ORDER — SODIUM CHLORIDE 0.9 % IR SOLN
Status: DC | PRN
  Administered 2024-09-09: 1

## 2024-09-09 MED ORDER — TETANUS-DIPHTH-ACELL PERTUSSIS 5-2-15.5 LF-MCG/0.5 IM SUSP: 0.5000 mL | Freq: Once | INTRAMUSCULAR | Status: DC

## 2024-09-09 MED ORDER — GABAPENTIN 100 MG PO CAPS
100.0000 mg | ORAL_CAPSULE | Freq: Three times a day (TID) | ORAL | Status: DC
  Administered 2024-09-09 – 2024-09-12 (×5): 100 mg via ORAL
  Filled 2024-09-09 (×7): qty 1

## 2024-09-09 MED ORDER — POTASSIUM CHLORIDE CRYS ER 20 MEQ PO TBCR: 20.0000 meq | EXTENDED_RELEASE_TABLET | Freq: Every day | ORAL | Status: DC

## 2024-09-09 MED ORDER — LIDOCAINE HCL (CARDIAC) PF 100 MG/5ML IV SOSY
PREFILLED_SYRINGE | INTRAVENOUS | Status: DC | PRN
  Administered 2024-09-09: 60 mg via INTRATRACHEAL

## 2024-09-09 MED ORDER — FENTANYL CITRATE (PF) 100 MCG/2ML IJ SOLN
INTRAMUSCULAR | Status: AC
  Filled 2024-09-09: qty 2

## 2024-09-09 MED ORDER — SODIUM CHLORIDE 0.9% FLUSH: 3.0000 mL | INTRAVENOUS | Status: DC | PRN

## 2024-09-09 MED ORDER — KETOROLAC TROMETHAMINE 30 MG/ML IJ SOLN
30.0000 mg | Freq: Four times a day (QID) | INTRAMUSCULAR | Status: AC
  Administered 2024-09-09 – 2024-09-10 (×2): 30 mg via INTRAVENOUS
  Filled 2024-09-09 (×2): qty 1

## 2024-09-09 MED ORDER — MORPHINE SULFATE (PF) 0.5 MG/ML IJ SOLN
INTRAMUSCULAR | Status: AC
  Filled 2024-09-09: qty 10

## 2024-09-09 MED ORDER — ALBUMIN HUMAN 5 % IV SOLN: INTRAVENOUS | Status: DC | PRN

## 2024-09-09 MED ORDER — TERBUTALINE SULFATE 1 MG/ML IJ SOLN
INTRAMUSCULAR | Status: AC
  Filled 2024-09-09: qty 1

## 2024-09-09 MED ORDER — STERILE WATER FOR IRRIGATION IR SOLN
Status: DC | PRN
  Administered 2024-09-09: 1000 mL

## 2024-09-09 MED ORDER — DEXTROSE 5 % IV SOLN
INTRAVENOUS | Status: DC | PRN
  Administered 2024-09-09: 3 g via INTRAVENOUS

## 2024-09-09 MED ORDER — SUCCINYLCHOLINE CHLORIDE 200 MG/10ML IV SOSY
PREFILLED_SYRINGE | INTRAVENOUS | Status: DC | PRN
  Administered 2024-09-09: 40 mg via INTRAVENOUS
  Administered 2024-09-09: 160 mg via INTRAVENOUS

## 2024-09-09 MED ORDER — HYDROMORPHONE HCL 1 MG/ML IJ SOLN
INTRAMUSCULAR | Status: AC
  Filled 2024-09-09: qty 0.5

## 2024-09-09 MED ORDER — DEXAMETHASONE SOD PHOSPHATE PF 10 MG/ML IJ SOLN
INTRAMUSCULAR | Status: DC | PRN
  Administered 2024-09-09: 10 mg via INTRAVENOUS

## 2024-09-09 MED ORDER — DIPHENHYDRAMINE HCL 50 MG/ML IJ SOLN: 12.5000 mg | INTRAMUSCULAR | Status: DC | PRN

## 2024-09-09 MED ORDER — ONDANSETRON HCL 4 MG/2ML IJ SOLN
INTRAMUSCULAR | Status: DC | PRN
  Administered 2024-09-09: 4 mg via INTRAVENOUS

## 2024-09-09 MED ORDER — IBUPROFEN 200 MG PO TABS: 400.0000 mg | ORAL_TABLET | Freq: Four times a day (QID) | ORAL | Status: DC

## 2024-09-09 MED ORDER — FUROSEMIDE 20 MG PO TABS
20.0000 mg | ORAL_TABLET | Freq: Every day | ORAL | Status: DC
  Administered 2024-09-10: 20 mg via ORAL
  Filled 2024-09-09 (×2): qty 1

## 2024-09-09 MED ORDER — PRENATAL MULTIVITAMIN CH
1.0000 | ORAL_TABLET | Freq: Every day | ORAL | Status: DC
  Filled 2024-09-09: qty 1

## 2024-09-09 MED ORDER — OXYCODONE HCL 5 MG PO TABS
5.0000 mg | ORAL_TABLET | ORAL | Status: DC | PRN
  Administered 2024-09-09 – 2024-09-10 (×2): 10 mg via ORAL
  Filled 2024-09-09 (×2): qty 2

## 2024-09-09 MED ORDER — OXYTOCIN-SODIUM CHLORIDE 30-0.9 UT/500ML-% IV SOLN: 2.5000 [IU]/h | INTRAVENOUS | Status: AC

## 2024-09-09 MED ORDER — DIPHENHYDRAMINE HCL 25 MG PO CAPS
25.0000 mg | ORAL_CAPSULE | ORAL | Status: DC | PRN
Start: 2024-09-09 — End: 2024-09-12

## 2024-09-09 MED ORDER — ROCURONIUM BROMIDE 100 MG/10ML IV SOLN
INTRAVENOUS | Status: DC | PRN
  Administered 2024-09-09: 50 mg via INTRAVENOUS

## 2024-09-09 MED ORDER — OXYCODONE HCL 5 MG PO TABS: 5.0000 mg | ORAL_TABLET | Freq: Once | ORAL | Status: DC | PRN

## 2024-09-09 MED ORDER — SUGAMMADEX SODIUM 200 MG/2ML IV SOLN
INTRAVENOUS | Status: DC | PRN
  Administered 2024-09-09: 400 mg via INTRAVENOUS

## 2024-09-09 MED ORDER — NIFEDIPINE ER OSMOTIC RELEASE 30 MG PO TB24
30.0000 mg | ORAL_TABLET | Freq: Every day | ORAL | Status: DC
  Filled 2024-09-09: qty 1

## 2024-09-09 MED ORDER — NALOXONE HCL 4 MG/10ML IJ SOLN: 1.0000 ug/kg/h | INTRAVENOUS | Status: DC | PRN

## 2024-09-09 MED ORDER — NALOXONE HCL 0.4 MG/ML IJ SOLN: 0.4000 mg | INTRAMUSCULAR | Status: DC | PRN

## 2024-09-09 MED ORDER — SENNOSIDES-DOCUSATE SODIUM 8.6-50 MG PO TABS
2.0000 | ORAL_TABLET | Freq: Every day | ORAL | Status: DC
  Administered 2024-09-10 – 2024-09-12 (×2): 2 via ORAL
  Filled 2024-09-09 (×3): qty 2

## 2024-09-09 MED ORDER — SIMETHICONE 80 MG PO CHEW: 80.0000 mg | CHEWABLE_TABLET | ORAL | Status: DC | PRN

## 2024-09-09 MED ORDER — AMLODIPINE 1 MG/ML ORAL SUSPENSION
10.0000 mg | Freq: Every day | ORAL | Status: DC
  Administered 2024-09-09: 10 mg via ORAL
  Filled 2024-09-09 (×3): qty 10

## 2024-09-09 MED ORDER — OXYCODONE HCL 5 MG/5ML PO SOLN: 5.0000 mg | Freq: Once | ORAL | Status: DC | PRN

## 2024-09-09 MED ORDER — AMLODIPINE BESYLATE 10 MG PO TABS
10.0000 mg | ORAL_TABLET | Freq: Every day | ORAL | Status: DC
  Administered 2024-09-10 – 2024-09-11 (×2): 10 mg via ORAL
  Filled 2024-09-09 (×2): qty 1
  Filled 2024-09-09: qty 2

## 2024-09-09 MED ORDER — ENOXAPARIN SODIUM 80 MG/0.8ML IJ SOSY
70.0000 mg | PREFILLED_SYRINGE | INTRAMUSCULAR | Status: DC
  Administered 2024-09-09 – 2024-09-11 (×3): 70 mg via SUBCUTANEOUS
  Filled 2024-09-09 (×3): qty 0.8

## 2024-09-09 MED ORDER — LIDOCAINE-EPINEPHRINE (PF) 2 %-1:200000 IJ SOLN
INTRAMUSCULAR | Status: DC | PRN
  Administered 2024-09-09 (×4): 5 mL via EPIDURAL

## 2024-09-09 MED ORDER — MEDROXYPROGESTERONE ACETATE 150 MG/ML IM SUSP: 150.0000 mg | INTRAMUSCULAR | Status: DC | PRN

## 2024-09-09 MED ORDER — DIPHENHYDRAMINE HCL 25 MG PO CAPS: 25.0000 mg | ORAL_CAPSULE | Freq: Four times a day (QID) | ORAL | Status: DC | PRN

## 2024-09-09 MED ORDER — ACETAMINOPHEN 500 MG PO TABS
1000.0000 mg | ORAL_TABLET | Freq: Four times a day (QID) | ORAL | Status: DC
  Administered 2024-09-09 – 2024-09-10 (×2): 1000 mg via ORAL
  Filled 2024-09-09 (×4): qty 2

## 2024-09-09 MED ORDER — ALBUTEROL SULFATE HFA 108 (90 BASE) MCG/ACT IN AERS
INHALATION_SPRAY | RESPIRATORY_TRACT | Status: DC | PRN
  Administered 2024-09-09: 2 via RESPIRATORY_TRACT

## 2024-09-09 MED ORDER — SODIUM CHLORIDE 0.9 % IV SOLN
INTRAVENOUS | Status: DC | PRN
  Administered 2024-09-09: 500 mg via INTRAVENOUS

## 2024-09-09 MED ORDER — IBUPROFEN 600 MG PO TABS: 600.0000 mg | ORAL_TABLET | Freq: Four times a day (QID) | ORAL | Status: DC

## 2024-09-09 MED ORDER — WITCH HAZEL-GLYCERIN EX PADS: 1.0000 | MEDICATED_PAD | CUTANEOUS | Status: DC | PRN

## 2024-09-09 MED ORDER — ONDANSETRON HCL 4 MG/2ML IJ SOLN: 4.0000 mg | Freq: Four times a day (QID) | INTRAMUSCULAR | Status: DC | PRN

## 2024-09-09 MED ORDER — PROPOFOL 10 MG/ML IV BOLUS
INTRAVENOUS | Status: DC | PRN
  Administered 2024-09-09: 200 mg via INTRAVENOUS
  Administered 2024-09-09: 100 mg via INTRAVENOUS

## 2024-09-09 MED ORDER — DIBUCAINE (PERIANAL) 1 % EX OINT
1.0000 | TOPICAL_OINTMENT | CUTANEOUS | Status: DC | PRN
Start: 2024-09-09 — End: 2024-09-12

## 2024-09-09 MED ORDER — SIMETHICONE 80 MG PO CHEW
80.0000 mg | CHEWABLE_TABLET | Freq: Three times a day (TID) | ORAL | Status: DC
  Administered 2024-09-09 – 2024-09-12 (×6): 80 mg via ORAL
  Filled 2024-09-09 (×6): qty 1

## 2024-09-09 MED ORDER — MEPERIDINE HCL 25 MG/ML IJ SOLN: 6.2500 mg | INTRAMUSCULAR | Status: DC | PRN

## 2024-09-09 MED ORDER — COCONUT OIL OIL: 1.0000 | TOPICAL_OIL | Status: DC | PRN

## 2024-09-09 MED ORDER — FENTANYL CITRATE (PF) 100 MCG/2ML IJ SOLN
INTRAMUSCULAR | Status: DC | PRN
  Administered 2024-09-09: 100 ug via EPIDURAL

## 2024-09-09 MED ORDER — PHENOL 1.4 % MT LIQD
2.0000 | OROMUCOSAL | Status: DC | PRN
Start: 2024-09-09 — End: 2024-09-12
  Administered 2024-09-09: 2 via OROMUCOSAL
  Filled 2024-09-09: qty 177

## 2024-09-09 MED ORDER — HYDROMORPHONE HCL 1 MG/ML IJ SOLN
0.2500 mg | INTRAMUSCULAR | Status: DC | PRN
  Administered 2024-09-09: 0.5 mg via INTRAVENOUS

## 2024-09-09 MED ORDER — MORPHINE SULFATE (PF) 0.5 MG/ML IJ SOLN
INTRAMUSCULAR | Status: DC | PRN
  Administered 2024-09-09: 3 mg via EPIDURAL

## 2024-09-09 SURGICAL SUPPLY — 38 items
BENZOIN TINCTURE PRP APPL 2/3 (GAUZE/BANDAGES/DRESSINGS) IMPLANT
CANISTER WOUND CARE 500ML ATS (WOUND CARE) IMPLANT
CHLORAPREP W/TINT 26 (MISCELLANEOUS) ×2 IMPLANT
CLAMP UMBILICAL CORD (MISCELLANEOUS) ×1 IMPLANT
CLOTH BEACON ORANGE TIMEOUT ST (SAFETY) ×1 IMPLANT
DERMABOND ADVANCED .7 DNX12 (GAUZE/BANDAGES/DRESSINGS) ×2 IMPLANT
DRESSING PREVENA PLUS CUSTOM (GAUZE/BANDAGES/DRESSINGS) IMPLANT
DRSG OPSITE POSTOP 4X10 (GAUZE/BANDAGES/DRESSINGS) ×1 IMPLANT
ELECTRODE REM PT RTRN 9FT ADLT (ELECTROSURGICAL) ×1 IMPLANT
EXTENDER TRAXI PANNICULUS (MISCELLANEOUS) IMPLANT
EXTRACTOR VACUUM BELL STYLE (SUCTIONS) IMPLANT
GAUZE PAD ABD 7.5X8 STRL (GAUZE/BANDAGES/DRESSINGS) IMPLANT
GAUZE SPONGE 4X4 12PLY STRL LF (GAUZE/BANDAGES/DRESSINGS) IMPLANT
GLOVE BIOGEL PI IND STRL 7.0 (GLOVE) ×1 IMPLANT
GLOVE BIOGEL PI IND STRL 8 (GLOVE) ×1 IMPLANT
GLOVE ECLIPSE 8.0 STRL XLNG CF (GLOVE) ×1 IMPLANT
GOWN STRL REUS W/TWL LRG LVL3 (GOWN DISPOSABLE) ×2 IMPLANT
KIT ABG SYR 3ML LUER SLIP (SYRINGE) ×1 IMPLANT
MAT PREVALON FULL STRYKER (MISCELLANEOUS) IMPLANT
NDL HYPO 18GX1.5 BLUNT FILL (NEEDLE) ×2 IMPLANT
NDL HYPO 25X5/8 SAFETYGLIDE (NEEDLE) ×2 IMPLANT
NEEDLE HYPO 22GX1.5 SAFETY (NEEDLE) ×1 IMPLANT
NS IRRIG 1000ML POUR BTL (IV SOLUTION) ×1 IMPLANT
PACK C SECTION WH (CUSTOM PROCEDURE TRAY) ×1 IMPLANT
PAD OB MATERNITY 4.3X12.25 (PERSONAL CARE ITEMS) ×1 IMPLANT
RETRACTOR TRAXI PANNICULUS (MISCELLANEOUS) IMPLANT
RTRCTR C-SECT PINK 25CM LRG (MISCELLANEOUS) IMPLANT
STRIP CLOSURE SKIN 1/2X4 (GAUZE/BANDAGES/DRESSINGS) IMPLANT
SUT CHROMIC 0 CT 1 (SUTURE) ×1 IMPLANT
SUT MNCRL 0 VIOLET CTX 36 (SUTURE) ×2 IMPLANT
SUT PLAIN ABS 2-0 CT1 27XMFL (SUTURE) IMPLANT
SUT VIC AB 0 CTX36XBRD ANBCTRL (SUTURE) ×1 IMPLANT
SUT VIC AB 4-0 KS 27 (SUTURE) IMPLANT
SUTURE PLAIN GUT 2.0 ETHICON (SUTURE) IMPLANT
TAPE CLOTH SURG 4X10 WHT LF (GAUZE/BANDAGES/DRESSINGS) IMPLANT
TOWEL OR 17X24 6PK STRL BLUE (TOWEL DISPOSABLE) ×1 IMPLANT
TRAY FOLEY W/BAG SLVR 14FR LF (SET/KITS/TRAYS/PACK) IMPLANT
WATER STERILE IRR 1000ML POUR (IV SOLUTION) ×1 IMPLANT

## 2024-09-09 NOTE — Progress Notes (Signed)
 LABOR PROGRESS NOTE Pt rechecked at 2345 Epidural has been replaced, still painful during contractions. No pain in between ctx and nontender to palpation. Afebrile.   Pit at 1 SCE: unchanged. Exam uncomfortable.  IUPC replaced.   FHT: baseline 120, mod variability, +accels, early and variable decels; overall category II. Toco: q2-4 min  A/P:  Continue to work on pain mgmt. IV Fentanyl  per Dr. Jayne. Contact anesthesia again for further troubleshooting.    Barabara Maier, DO 12:18 AM

## 2024-09-09 NOTE — Transfer of Care (Signed)
 Immediate Anesthesia Transfer of Care Note  Patient: Carly Murray  Procedure(s) Performed: CESAREAN DELIVERY (Abdomen)  Patient Location: PACU  Anesthesia Type:General and Epidural  Level of Consciousness: awake, alert , oriented, and sedated  Airway & Oxygen Therapy: Patient Spontanous Breathing and Patient connected to face mask oxygen  Post-op Assessment: Report given to RN, Post -op Vital signs reviewed and stable, Patient moving all extremities, and Patient moving all extremities X 4  Post vital signs: Reviewed and stable  Last Vitals:  Vitals Value Taken Time  BP 148/86 09/09/24 04:50  Temp    Pulse 106 09/09/24 04:50  Resp 22 09/09/24 04:50  SpO2 99 % 09/09/24 04:50  Vitals shown include unfiled device data.  Last Pain:  Vitals:   09/09/24 0155  TempSrc:   PainSc: 9          Complications:  Encounter Notable Events  Notable Event Outcome Phase Comment  Difficult to intubate - unexpected  Intraprocedure Filed from anesthesia note documentation.

## 2024-09-09 NOTE — Anesthesia Procedure Notes (Signed)
 Procedure Name: Intubation Date/Time: 09/09/2024 3:20 AM  Performed by: Gregorio Adrien SAUNDERS, CRNAPre-anesthesia Checklist: Patient identified, Patient being monitored, Timeout performed, Emergency Drugs available and Suction available Patient Re-evaluated:Patient Re-evaluated prior to induction Oxygen Delivery Method: Circle System Utilized Preoxygenation: Pre-oxygenation with 100% oxygen Induction Type: IV induction, Rapid sequence and Cricoid Pressure applied Ventilation: Mask ventilation without difficulty and Two handed mask ventilation required LMA Size: 4.0 Laryngoscope Size: Mac and 4 Grade View: Grade IV Tube type: Oral Tube size: 7.0 mm Number of attempts: 1 Airway Equipment and Method: stylet Placement Confirmation: ETT inserted through vocal cords under direct vision, positive ETCO2 and breath sounds checked- equal and bilateral Secured at: 24 cm Tube secured with: Tape Dental Injury: Teeth and Oropharynx as per pre-operative assessment  Difficulty Due To: Difficulty was unanticipated, Difficult Airway- due to limited oral opening and Difficult Airway- due to anterior larynx Comments: DL times 3, First attempt with a glide scope #3, unable to pass the ETT. LMA inserted and bag to adequate saturation. Finally, DL with MAC 4 by DR. Miller with success

## 2024-09-09 NOTE — Progress Notes (Signed)
 Seen at bedside per patient request d/t sore throat and elevated blood pressures.   S: Noting that her throat is very painful and it is difficult for her to swallow. Also noting that she is in pain for her C section and doesn't want to move much right now. Denies other symptoms at this time, including shortness of breath, chest pain, changes in vision.  O: Today's Vitals   09/09/24 1039 09/09/24 1045 09/09/24 1107 09/09/24 1109  BP:   (!) 155/78   Pulse:      Resp:   (!) 26   Temp:   98.6 F (37 C)   TempSrc:   Oral   SpO2:  95% 94%   Weight:      Height:      PainSc: Asleep   10-Worst pain ever   Body mass index is 55.62 kg/m. Gen: Laying in bed, falling asleep throughout conversation Mouth/Throat: No visible erythema or swelling of tongue soft palate, unable to appreciate throat swelling d/t patient discomfort  A/P: Throat discomfort likely in relation to difficult intubation prior to pLTCS for fetal intolerance. Given phenol spray with little relief. Added benzocaine  spray. Will hold off on lozenges d/t choking hazard as patient was falling asleep during conversation. For blood pressures, patient has known chronic hypertension. Attempted nifedipine  PO but patient unable to tolerate pills at this time. Will switch to amlodipine  liquid. Will continues with lasix  and potassium pills as patient tolerates. All questions answered to patient satisfaction prior to this provider leaving the room.  Charlie DELENA Courts, MD

## 2024-09-09 NOTE — Progress Notes (Signed)
 PCA was discontinued at 1815 per patient's request. Per PT, used PCA once with little to no relief. [place]  Claudene, CNM  placed order to discontinue. Pt felt sore throat pain relief from IV toradol . Pt has agreed to attempt to swallow PO Tylenol  due for 1800. Ambulated Pt in hall and Pt tolerated it well. /changed IV to NSL.

## 2024-09-09 NOTE — Discharge Summary (Addendum)
 Postpartum Discharge Summary     Patient Name: Carly Murray DOB: 11/23/1985 MRN: [id number]  Date of admission: 09/08/2024 Delivery date:09/09/2024 Delivering provider: JAYNE VONN DEL Date of discharge: 09/12/2024  Admitting diagnosis: Encounter for induction of labor [Z34.90] Intrauterine pregnancy: [redacted]w[redacted]d     Secondary diagnosis:  Active Problems:   Obesity in pregnancy   Antepartum multigravida of advanced maternal age   Prediabetes   Chronic hypertension affecting pregnancy   GBS (group B Streptococcus carrier), +RV culture, currently pregnant   Status post primary low transverse cesarean section   Difficult intubation  Additional problems: intubation injury    Discharge diagnosis: Term Pregnancy Delivered and CHTN                                              Post partum procedures:None Augmentation: AROM, Pitocin , and IP Foley Complications: None  Hospital course: Induction of Labor With Cesarean Section   38 y.o. yo G2P1102 at [redacted]w[redacted]d was admitted to the hospital 09/08/2024 for induction of labor. Patient had a labor course significant for fetal intolerance of labor and inadequate pain control despite multiple epidurals. She subsequently required general anesthesia for the surgery. The patient went for cesarean section due to Non-Reassuring FHR. Delivery details are as follows: Membrane Rupture Time/Date: 2:47 PM,09/08/2024  Delivery Method:C-Section, Low Transverse Operative Delivery:N/A Details of operation can be found in separate operative Note.    Patient had a postpartum course complicated by intubation injury, which was evaluated by ENT. Patient was found to have retropharyngeal free air extending to upper mediastinum on CT neck, patient received 2 doses of methylprednisolone and IV Unasyn and symptomatically improved significantly. She was transitioned to Augmentin at DC. She is ambulating, tolerating a regular diet, passing flatus, and urinating well.  Patient  is discharged home in stable condition on 09/12/24.      Newborn Data: Birth date:09/09/2024 Birth time:3:29 AM Gender:Female Living status:Living Apgars:3 ,8  Weight:4 lb 12.5 oz (2.17 kg)                               Magnesium Sulfate received: No BMZ received: No Rhophylac:N/A MMR:N/A T-DaP:Given prenatally Flu: No RSV Vaccine received: No Transfusion:No  Immunizations received: Immunization History  Administered Date(s) Administered   PPD Test 08/28/2021   Tdap 05/12/2018, 07/02/2024    Physical exam  Vitals:   09/11/24 1900 09/11/24 2030 09/11/24 2345 09/12/24 0640  BP: 134/82 (!) 151/87 133/88 104/72  Pulse: (!) 101 88 85 90  Resp:  20 19 19   Temp:  97.7 F (36.5 C) 98 F (36.7 C) 97.7 F (36.5 C)  TempSrc:  Oral Oral Oral  SpO2:   100% 100%  Weight:      Height:       General: alert, cooperative, and no distress Lochia: appropriate Uterine Fundus: firm Incision: Healing well with no significant drainage, Dressing is clean, dry, and intact, provena in place DVT Evaluation: No evidence of DVT seen on physical exam. Negative Homan's sign. No cords or calf tenderness. No significant calf/ankle edema.  Labs: Lab Results  Component Value Date   WBC 21.6 (H) 09/11/2024   HGB 10.7 (L) 09/11/2024   HCT 31.8 (L) 09/11/2024   MCV 91.1 09/11/2024   PLT 323 09/11/2024      Latest Ref Rng &  Units 09/11/2024    7:23 AM  CMP  Glucose 70 - 99 mg/dL 897   BUN 6 - 20 mg/dL 10   Creatinine 9.55 - 1.00 mg/dL 9.30   Sodium 864 - 854 mmol/L 139   Potassium 3.5 - 5.1 mmol/L 3.7   Chloride 98 - 111 mmol/L 107   CO2 22 - 32 mmol/L 21   Calcium 8.9 - 10.3 mg/dL 8.4   Total Protein 6.5 - 8.1 g/dL 6.1   Total Bilirubin 0.0 - 1.2 mg/dL 0.5   Alkaline Phos 38 - 126 U/L 108   AST 15 - 41 U/L 34   ALT 0 - 44 U/L 33    Edinburgh Score:    09/11/2024    8:42 AM  Edinburgh Postnatal Depression Scale Screening Tool  I have been able to laugh and see the funny side  of things. --   No data recorded  After visit meds:  Allergies as of 09/12/2024   No Known Allergies      Medication List     TAKE these medications    amoxicillin -clavulanate 875-125 MG tablet Commonly known as: AUGMENTIN Take 1 tablet by mouth 2 (two) times daily for 7 days. Take first dose tonight   furosemide  20 MG tablet Commonly known as: LASIX  Take 1 tablet (20 mg total) by mouth daily for 10 days. Start taking on: September 13, 2024   ibuprofen  600 MG tablet Commonly known as: ADVIL  Take 1 tablet (600 mg total) by mouth every 6 (six) hours as needed for mild pain (pain score 1-3) or moderate pain (pain score 4-6).   menthol  3 MG lozenge Commonly known as: CEPACOL Take 1 lozenge (3 mg total) by mouth every 2 (two) hours as needed for sore throat.   NIFEdipine  30 MG 24 hr tablet Commonly known as: PROCARDIA -XL/NIFEDICAL-XL Take 1 tablet (30 mg total) by mouth every morning.   oxyCODONE  5 MG immediate release tablet Commonly known as: Oxy IR/ROXICODONE  Take 1 tablet (5 mg total) by mouth every 4 (four) hours as needed for severe pain (pain score 7-10).   phenol 1.4 % Liqd Commonly known as: CHLORASEPTIC Use as directed 2 sprays in the mouth or throat as needed for throat irritation / pain.   potassium chloride  SA 20 MEQ tablet Commonly known as: KLOR-CON  M Take 1 tablet (20 mEq total) by mouth daily for 10 days. Start taking on: September 13, 2024   PRENATAL VITAMINS PO Take 1 capsule by mouth daily.   Stool Softener/Laxative 50-8.6 MG tablet Generic drug: senna-docusate Take 2 tablets by mouth daily.         Discharge home in stable condition Infant Feeding: Breast Infant Disposition:home with mother Discharge instruction: per After Visit Summary and Postpartum booklet. Activity: Advance as tolerated. Pelvic rest for 6 weeks.  Diet: routine diet Future Appointments: Future Appointments  Date Time Provider Department Center  09/14/2024 11:00 AM  [facility] NURSE [facility] None  10/20/2024 10:35 AM Synthia Raisin, CNM [facility] None   Follow up Visit: Message sent 11/26, appointments listed above   09/12/2024 Carly Amy, MD  Attestation of Supervision of Resident: Evaluation and management procedures were performed by the learners: Family Medicine Resident under my supervision. I was immediately available for direct supervision, assistance and direction throughout this encounter.  I also confirm that I have verified the information documented in the resident's note, and that I have also personally reperformed the pertinent components of the physical exam and all of the medical decision making  activities.  I have also made any necessary editorial changes.  Carly DELENA Courts, MD FM-OB Fellow Center for [facility]

## 2024-09-09 NOTE — Progress Notes (Signed)
 CNM brought another pt up to floor and noticed the fetal tracing from the desk. @ [phone] CNM to bedside to assess and on arrival RNs turning patient and IV bolus going. With permission CNM checked cervix 4/90/-2 S. Emilio CNM fetal head well applied to cervix and FSE securely in place. Upon completion of exam Dr. Jayne at bedside. Reported off cervical exam and Dr. Jayne discussing plan of care with patient.   Dayane Hillenburg Erven) Emilio, MSN, CNM  Center for [facility]  09/09/2024 2:44 AM

## 2024-09-09 NOTE — Op Note (Signed)
 Cesarean Section Procedure Note   Carly Murray   09/09/2024 Indications: Fetal Distress   Pre-operative Diagnosis: Primary cesarean section for fetal intolerance of labor  Post-operative Diagnosis: Same   Surgeon: Surgeons and Role:    * Jayne Vonn DEL, MD - Primary    * Danny Geralds, DO - Fellow  Anesthesia: general after failed epidural    Estimated Blood Loss: 555 mL  Total IV Fluids:  Urine Output: 200cc  Specimens: * No order type specified *  Findings:  Baby condition / location:  Couplet care / Skin to Skin  Living status: Living Sex: Female APGAR: 3, 8 Weight: 2170g  Complications: no complications  Indications: Carly Murray is 38 y.o. [id number] with an IUP at [redacted]w[redacted]d presenting for IOL for cHTN. Decision for CS given fetal intolerance of labor.   The risks, benefits, complications, treatment options, and expected outcomes were discussed with the patient . The patient concurred with the proposed plan, giving informed consent. identified as Carly Murray  and the procedure verified as C-Section Delivery.  Procedure Details: The patient was taken back to the operative suite where general anesthesia was placed. A Time Out was held and the above information confirmed.    After induction of anesthesia, the patient was draped and prepped in the usual sterile manner and placed in a dorsal supine position with a leftward tilt. A transverse was made and carried down through the subcutaneous tissue to the fascia. Fascial incision was made and extended transversely. The fascia was separated from the underlying rectus tissue superiorly and inferiorly. The peritoneum was identified and entered. Peritoneal incision was extended longitudinally. The utero-vesical peritoneal reflection was incised transversely and the bladder flap was bluntly freed from the lower uterine segment. A low transverse uterine incision was made. Delivered from cephalic presentation was a 2170g  gram Female with Apgar scores of 3 at one minute and 8 at five minutes. Cord ph was sent the umbilical cord was clamped and cut cord blood was obtained for evaluation. The placenta was removed Intact and appeared normal. The uterine outline, tubes and ovaries appeared normal. The uterine incision was closed with running locked sutures of 0 Monocryl. A single interrupted stitch was place on the superior margin on the left side. Hemostasis was observed.The peritoneum was not closed. The fascia was then reapproximated with running sutures of 0Vicryl. Lavage was carried out until clear. The subcuticular closure was performed in 2 layers using 2-0 plain gut. The skin was closed with 4-0Vicryl.   Instrument, sponge, and needle counts were correct prior the abdominal closure and were correct at the conclusion of the case.    Disposition: PACU - hemodynamically stable.   Maternal Condition: stable   Signed: Geralds Danny, DO 09/09/2024 5:02 AM

## 2024-09-09 NOTE — Anesthesia Postprocedure Evaluation (Signed)
 Anesthesia Post Note  Patient: Carly Murray  Procedure(s) Performed: CESAREAN DELIVERY (Abdomen)     Patient location during evaluation: PACU Anesthesia Type: General Level of consciousness: awake and alert Pain management: pain level controlled Vital Signs Assessment: post-procedure vital signs reviewed and stable Respiratory status: spontaneous breathing, nonlabored ventilation and respiratory function stable Cardiovascular status: blood pressure returned to baseline and stable Postop Assessment: no apparent nausea or vomiting Anesthetic complications: yes   Encounter Notable Events  Notable Event Outcome Phase Comment  Difficult to intubate - unexpected  Intraprocedure Filed from anesthesia note documentation.    Last Vitals:  Vitals:   09/09/24 0602 09/09/24 0603  BP:    Pulse: 89 89  Resp: 19 (!) 22  Temp:    SpO2: 94% (!) 89%    Last Pain:  Vitals:   09/09/24 0540  TempSrc:   PainSc: 7    Pain Goal:                Epidural/Spinal Function Cutaneous sensation: Able to Wiggle Toes (09/09/24 0540), Patient able to flex knees: Yes (09/09/24 0540), Patient able to lift hips off bed: Yes (09/09/24 0540), Back pain beyond tenderness at insertion site: No (09/09/24 0540), Progressively worsening motor and/or sensory loss: No (09/09/24 0540), Bowel and/or bladder incontinence post epidural: No (09/09/24 0540)  Butler Levander Pinal

## 2024-09-09 NOTE — Lactation Note (Signed)
 This note was copied from a baby's chart. Lactation Consultation Note  Patient Name: Carly Murray Date: 09/09/2024 Age:37 hours Per RN,  MOB decided not to breastfeed infant will be formula feeding only.   Maternal Data    Feeding    LATCH Score                    Lactation Tools Discussed/Used    Interventions    Discharge    Consult Status      Grayce LULLA Batter 09/09/2024, 8:26 AM

## 2024-09-09 NOTE — Progress Notes (Signed)
 I was asked to come see the patient due to concerns for sore throat and hoarse voice. I did not see any obvious injury in her mouth but could not see the entirety of the soft palate or the uvula. I explained to her that given that her epidural did not provide adequate anesthesia for surgery, general anesthesia with intubation was required. I also explained that pregnant women are also at an increased risk of being a difficult airway. She proved to be a difficult airway and required multiple attempts at intubation with LMA placement in between. I explained that when this happens, a sore throat and a hoarse voice are very common afterwards. I explained that this can last for a few days and that she could try gargling with salt water , using a numbing spray, or using a throat lozenge. She has both the spray and the lozenges ordered. I asked if she had any more questions or concerns, and she said no. I told her if she or her partner had any more questions or concerns later, they could have the nurse call for the anesthesiologist and we would be happy to address them.  Gearldine Arch, MD

## 2024-09-10 ENCOUNTER — Encounter (HOSPITAL_COMMUNITY): Payer: Self-pay | Admitting: Obstetrics & Gynecology

## 2024-09-10 LAB — CBC
HCT: 31 % — ABNORMAL LOW (ref 36.0–46.0)
Hemoglobin: 10.4 g/dL — ABNORMAL LOW (ref 12.0–15.0)
MCH: 30.8 pg (ref 26.0–34.0)
MCHC: 33.5 g/dL (ref 30.0–36.0)
MCV: 91.7 fL (ref 80.0–100.0)
Platelets: 322 K/uL (ref 150–400)
RBC: 3.38 MIL/uL — ABNORMAL LOW (ref 3.87–5.11)
RDW: 13.6 % (ref 11.5–15.5)
WBC: 23 K/uL — ABNORMAL HIGH (ref 4.0–10.5)
nRBC: 0 % (ref 0.0–0.2)

## 2024-09-10 MED ORDER — ACETAMINOPHEN 160 MG/5ML PO SOLN
1000.0000 mg | Freq: Four times a day (QID) | ORAL | Status: DC
  Administered 2024-09-10 (×3): 1000 mg via ORAL
  Filled 2024-09-10 (×3): qty 40.6

## 2024-09-10 MED ORDER — POTASSIUM CHLORIDE 20 MEQ PO PACK
20.0000 meq | PACK | Freq: Every day | ORAL | Status: DC
  Administered 2024-09-10: 20 meq via ORAL
  Filled 2024-09-10: qty 1

## 2024-09-10 MED ORDER — LIDOCAINE VISCOUS HCL 2 % MT SOLN
15.0000 mL | OROMUCOSAL | Status: DC | PRN
  Administered 2024-09-10 (×2): 15 mL via OROMUCOSAL
  Filled 2024-09-10 (×3): qty 15

## 2024-09-10 MED ORDER — IBUPROFEN 100 MG/5ML PO SUSP
600.0000 mg | Freq: Four times a day (QID) | ORAL | Status: DC
  Administered 2024-09-10 – 2024-09-12 (×3): 600 mg via ORAL
  Filled 2024-09-10 (×4): qty 30

## 2024-09-10 NOTE — Patient Instructions (Signed)
 If you are interested in an outpatient lactation consultation -- available in-office or virtually -- please reach out to us  at:  [facility] (First Floor) ?? [address], [place], [place]  ?? [phone] Please leave a message on our lactation voicemail box. We welcome any lactation-related questions or concerns -- our team is here to support you and your baby.  Lactation Support Groups Join us  at: [facility] ?? Tuesdays, 10:00 AM - 12:00 PM ?? [address], [place] [employer], [place]  Lactating parents and lap babies are welcome!  ?? [email or web address]  ?? [email or web address] -------------     Carly Murray, IBCLC Center for [facility]

## 2024-09-10 NOTE — Progress Notes (Addendum)
 Dr. Tilford OB Anesthesia called to let him know how the patient is saying her throat is really sore and very swollen. He will come up shortly to see her but states as long as patient is not having trouble breathing the pain should resolve in 2 to 3 days to continue regimen of pain meds ice cold things etc. Patient is talking and is not having trouble breathing at the moment.

## 2024-09-10 NOTE — Progress Notes (Signed)
 Post-Op Day 1, primary CS for NRFHT  Subjective: No complaints, up ad lib, voiding and tolerating PO, passing flatus,small lochia,.plans to breastfeed, unsure of BC, discussed  Objective: Blood pressure 133/71, pulse (!) 105, temperature 98.2 F (36.8 C), temperature source Oral, resp. rate 20, height 5' 2 (1.575 m), weight (!) 137.9 kg, last menstrual period 12/17/2023, SpO2 98%, unknown if currently breastfeeding.  Physical Exam:  General: alert, cooperative and no distress Lochia:normal flow Chest: CTAB Heart: RRR no m/r/g Abdomen: +BS, soft, nontender, dsg c/d/intact, no erythema, Prevena on Uterine Fundus: firm DVT Evaluation: No evidence of DVT seen on physical exam. Extremities: trace edema  Recent Labs    09/09/24 0851 09/10/24 0441  HGB 12.7 10.4*  HCT 37.0 31.0*    Assessment/Plan: Breastfeeding, Lactation consult, and Circumcision prior to discharge  Baby in 4#12oz, so baby won't be eligible for early DC   LOS: 2 days   Cathlean Ely 09/10/2024, 10:46 AM

## 2024-09-10 NOTE — Progress Notes (Signed)
 Dr. Tilford in to see patient he said the symptoms are to be expected with intubation and it takes a few days to see improvement.

## 2024-09-10 NOTE — Progress Notes (Signed)
 Sherrell Rover Dishmon called to be notified of patient states her pain In her throat is bad and she feels like it has gotten more swollen. Lidocaine  orders given for throat.

## 2024-09-11 ENCOUNTER — Inpatient Hospital Stay (HOSPITAL_COMMUNITY)

## 2024-09-11 ENCOUNTER — Encounter (HOSPITAL_COMMUNITY): Payer: Self-pay | Admitting: Family Medicine

## 2024-09-11 DIAGNOSIS — R221 Localized swelling, mass and lump, neck: Secondary | ICD-10-CM

## 2024-09-11 DIAGNOSIS — R52 Pain, unspecified: Secondary | ICD-10-CM

## 2024-09-11 DIAGNOSIS — T884XXA Failed or difficult intubation, initial encounter: Secondary | ICD-10-CM | POA: Diagnosis not present

## 2024-09-11 DIAGNOSIS — Z98891 History of uterine scar from previous surgery: Secondary | ICD-10-CM

## 2024-09-11 LAB — COMPREHENSIVE METABOLIC PANEL WITH GFR
ALT: 33 U/L (ref 0–44)
AST: 34 U/L (ref 15–41)
Albumin: 2.6 g/dL — ABNORMAL LOW (ref 3.5–5.0)
Alkaline Phosphatase: 108 U/L (ref 38–126)
Anion gap: 11 (ref 5–15)
BUN: 10 mg/dL (ref 6–20)
CO2: 21 mmol/L — ABNORMAL LOW (ref 22–32)
Calcium: 8.4 mg/dL — ABNORMAL LOW (ref 8.9–10.3)
Chloride: 107 mmol/L (ref 98–111)
Creatinine, Ser: 0.69 mg/dL (ref 0.44–1.00)
GFR, Estimated: >60 mL/min (ref >60)
Glucose, Bld: 102 mg/dL — ABNORMAL HIGH (ref 70–99)
Potassium: 3.7 mmol/L (ref 3.5–5.1)
Sodium: 139 mmol/L (ref 135–145)
Total Bilirubin: 0.5 mg/dL (ref 0.0–1.2)
Total Protein: 6.1 g/dL — ABNORMAL LOW (ref 6.5–8.1)

## 2024-09-11 LAB — CBC WITH DIFFERENTIAL/PLATELET
Abs Immature Granulocytes: 0.14 K/uL — ABNORMAL HIGH (ref 0.00–0.07)
Basophils Absolute: 0.1 K/uL (ref 0.0–0.1)
Basophils Relative: 0 %
Eosinophils Absolute: 0.1 K/uL (ref 0.0–0.5)
Eosinophils Relative: 0 %
HCT: 31.8 % — ABNORMAL LOW (ref 36.0–46.0)
Hemoglobin: 10.7 g/dL — ABNORMAL LOW (ref 12.0–15.0)
Immature Granulocytes: 1 %
Lymphocytes Relative: 15 %
Lymphs Abs: 3.3 K/uL (ref 0.7–4.0)
MCH: 30.7 pg (ref 26.0–34.0)
MCHC: 33.6 g/dL (ref 30.0–36.0)
MCV: 91.1 fL (ref 80.0–100.0)
Monocytes Absolute: 1.6 K/uL — ABNORMAL HIGH (ref 0.1–1.0)
Monocytes Relative: 7 %
Neutro Abs: 16.4 K/uL — ABNORMAL HIGH (ref 1.7–7.7)
Neutrophils Relative %: 77 %
Platelets: 323 K/uL (ref 150–400)
RBC: 3.49 MIL/uL — ABNORMAL LOW (ref 3.87–5.11)
RDW: 13.6 % (ref 11.5–15.5)
WBC: 21.6 K/uL — ABNORMAL HIGH (ref 4.0–10.5)
nRBC: 0 % (ref 0.0–0.2)

## 2024-09-11 LAB — LACTIC ACID, PLASMA: Lactic Acid, Venous: 0.7 mmol/L (ref 0.5–1.9)

## 2024-09-11 MED ORDER — KETOROLAC TROMETHAMINE 30 MG/ML IJ SOLN
30.0000 mg | Freq: Four times a day (QID) | INTRAMUSCULAR | Status: DC
  Administered 2024-09-11 – 2024-09-12 (×5): 30 mg via INTRAVENOUS
  Filled 2024-09-11 (×5): qty 1

## 2024-09-11 MED ORDER — NIFEDIPINE ER OSMOTIC RELEASE 30 MG PO TB24
30.0000 mg | ORAL_TABLET | Freq: Every day | ORAL | Status: DC
  Administered 2024-09-11 – 2024-09-12 (×2): 30 mg via ORAL
  Filled 2024-09-11 (×2): qty 1

## 2024-09-11 MED ORDER — IOHEXOL 350 MG/ML SOLN
75.0000 mL | Freq: Once | INTRAVENOUS | Status: AC | PRN
  Administered 2024-09-11: 75 mL via INTRAVENOUS

## 2024-09-11 MED ORDER — FUROSEMIDE 10 MG/ML IJ SOLN
40.0000 mg | Freq: Every day | INTRAMUSCULAR | Status: AC
  Administered 2024-09-11 – 2024-09-12 (×2): 40 mg via INTRAVENOUS
  Filled 2024-09-11 (×2): qty 4

## 2024-09-11 MED ORDER — SODIUM CHLORIDE 0.9 % IV SOLN
3.0000 g | Freq: Four times a day (QID) | INTRAVENOUS | Status: DC
  Administered 2024-09-11 – 2024-09-12 (×4): 3 g via INTRAVENOUS
  Filled 2024-09-11 (×4): qty 8

## 2024-09-11 MED ORDER — ACETAMINOPHEN 10 MG/ML IV SOLN
1000.0000 mg | Freq: Four times a day (QID) | INTRAVENOUS | Status: AC
  Administered 2024-09-11 – 2024-09-12 (×4): 1000 mg via INTRAVENOUS
  Filled 2024-09-11 (×4): qty 100

## 2024-09-11 MED ORDER — POTASSIUM CHLORIDE 10 MEQ/100ML IV SOLN
10.0000 meq | INTRAVENOUS | Status: AC
  Administered 2024-09-11 (×2): 10 meq via INTRAVENOUS
  Filled 2024-09-11 (×2): qty 100

## 2024-09-11 MED ORDER — METHYLPREDNISOLONE SODIUM SUCC 125 MG IJ SOLR
125.0000 mg | INTRAMUSCULAR | Status: AC
Start: 2024-09-11 — End: 2024-09-13
  Administered 2024-09-11 – 2024-09-12 (×2): 125 mg via INTRAVENOUS
  Filled 2024-09-11 (×2): qty 2

## 2024-09-11 NOTE — Progress Notes (Signed)
 Discussed care plan with ENT physician. Patient started on unasyn IV for intubation injury. Plan for repeat CBC on Sunday. If afebrile and WBC normal, patient to be discharged on augmentin for 7 days.  Charlie DELENA Courts, MD

## 2024-09-11 NOTE — Progress Notes (Addendum)
 POSTPARTUM PROGRESS NOTE  Post Operative Day 2  Subjective:  Carly Murray is a 38 y.o. [id number] s/p pLTCS at [redacted]w[redacted]d.  She reports she is doing very poorly due to throat pain and swelling after intubation for general anesthesia for CS. She had 8 out of 10 pain level, difficulty sleeping due to pain. She was taking pills yesterday, but then took some viscous lidocaine  which made symptoms worst and now unable to swallow food or pills. She is spitting up saliva as well. Neck is swollen--difficult for her to open her mouth and turn her neck. No difficulty breathing. Sprays not helping with pain, oxycodone  not helping with throat pain.  Pt had a severe range BP of 169/100, last night along with severe headache. No physician or provider notified. Pt currently without headache--improved after Toradol  IV. No RUQ pain, no difficulty breathing, +significant LE edema.   She denies any problems with ambulating, voiding. Incision pain is well controlled.  Lochia is Normal.  Objective: Blood pressure (!) 151/79, pulse 97, temperature 98 F (36.7 C), temperature source Oral, resp. rate 20, height 5' 2 (1.575 m), weight (!) 137.9 kg, last menstrual period 12/17/2023, SpO2 100%, unknown if currently breastfeeding.  BP Readings from Last 3 Encounters:  09/11/24 (!) 151/79  09/03/24 (!) 140/79  09/03/24 (!) 143/92    Physical Exam:  General: alert, cooperative and no distress Neck: large right sided palpable TTP soft tissue mass on the right side of neck with generalized neck swelling Chest: no respiratory distress Heart: regular rate  Uterine Fundus: firm, appropriately tender, Prevena in place Extremities: 2+ and pitting edema Skin: warm, dry  Recent Labs    09/10/24 0441 09/11/24 0723  HGB 10.4* 10.7*  HCT 31.0* 31.8*    Assessment/Plan: Carly Murray is a 38 y.o. [id number] s/p pLTCS at [redacted]w[redacted]d   POD# 2 - Doing poorly due to neck pain/swelling  Routine postpartum care  Delivery  Complications: general anesthesia with difficult intubation for  Blood Pressure: abnormal: elevated, start IV Lasix /Kcl since not taking PO Anemia/Hb Status: Stable, appropriate postpartum Hb, no intervention indicated Contraception: undecided Feeding: breast feeding  #cHTN--IV lasix  x 2 days, not currently taking PO BP meds (last dose yesterday), CBC, CMP ordered this morning  #neck swelling/throat pain -request anesthesia re-eval -consult ENT -CT neck w contrast--pending -CBC & lactic acid--pending -Solumedrol 125mg  IV daily x2 doses -IV acetaminophen  and Toradol  for pain  Dispo: Plan for discharge tomorrow.   LOS: 3 days   Leeroy KATHEE Pouch, MD OB Fellow  09/11/2024, 7:53 AM

## 2024-09-11 NOTE — Consult Note (Signed)
 Reason for Consult:neck pain Referring Physician: Dr Jayne Jenelle GORMAN Carly Murray is an 38 y.o. female.  HPI: she had C section and had difficult intubation. The guide scope tried first then the larygoscope and passed tube. No note of blood or injury by notes. The patient developed neck swelling, pain with turning and difficulty opening mouth. She was treated with steroids today and she is profoundly better. She can now turn head and open mouth. Able to swallow. No voice changes  Past Medical History:  Diagnosis Date   Obesity    Recurrent infection of skin     Past Surgical History:  Procedure Laterality Date   CESAREAN SECTION N/A 09/09/2024   Procedure: CESAREAN DELIVERY;  Surgeon: Jayne Vonn DEL, MD;  Location: [facility] LD ORS;  Service: Obstetrics;  Laterality: N/A;   KNEE SURGERY      Family History  Problem Relation Age of Onset   Diabetes Mother    COPD Mother    Hypertension Mother    Lupus Mother    Hepatitis C Father     Social History:  reports that she has never smoked. She has never used smokeless tobacco. She reports that she does not currently use alcohol. She reports that she does not use drugs.  Allergies: No Known Allergies  Medications: I have reviewed the patient's current medications.  Results for orders placed or performed during the hospital encounter of 09/08/24 (from the past 48 hours)  CBC     Status: Abnormal   Collection Time: 09/10/24  4:41 AM  Result Value Ref Range   WBC 23.0 (H) 4.0 - 10.5 K/uL   RBC 3.38 (L) 3.87 - 5.11 MIL/uL   Hemoglobin 10.4 (L) 12.0 - 15.0 g/dL   HCT 68.9 (L) 63.9 - 53.9 %   MCV 91.7 80.0 - 100.0 fL   MCH 30.8 26.0 - 34.0 pg   MCHC 33.5 30.0 - 36.0 g/dL   RDW 86.3 88.4 - 84.4 %   Platelets 322 150 - 400 K/uL   nRBC 0.0 0.0 - 0.2 %    Comment: Performed at [facility], [address] [place], [place] [address]  CBC with Differential/Platelet     Status: Abnormal   Collection Time: 09/11/24  7:23 AM  Result Value Ref  Range   WBC 21.6 (H) 4.0 - 10.5 K/uL   RBC 3.49 (L) 3.87 - 5.11 MIL/uL   Hemoglobin 10.7 (L) 12.0 - 15.0 g/dL   HCT 68.1 (L) 63.9 - 53.9 %   MCV 91.1 80.0 - 100.0 fL   MCH 30.7 26.0 - 34.0 pg   MCHC 33.6 30.0 - 36.0 g/dL   RDW 86.3 88.4 - 84.4 %   Platelets 323 150 - 400 K/uL   nRBC 0.0 0.0 - 0.2 %   Neutrophils Relative % 77 %   Neutro Abs 16.4 (H) 1.7 - 7.7 K/uL   Lymphocytes Relative 15 %   Lymphs Abs 3.3 0.7 - 4.0 K/uL   Monocytes Relative 7 %   Monocytes Absolute 1.6 (H) 0.1 - 1.0 K/uL   Eosinophils Relative 0 %   Eosinophils Absolute 0.1 0.0 - 0.5 K/uL   Basophils Relative 0 %   Basophils Absolute 0.1 0.0 - 0.1 K/uL   Immature Granulocytes 1 %   Abs Immature Granulocytes 0.14 (H) 0.00 - 0.07 K/uL    Comment: Performed at [facility], [address] [place], [place] [address]  Comprehensive metabolic panel     Status:  Abnormal   Collection Time: 09/11/24  7:23 AM  Result Value Ref Range   Sodium 139 135 - 145 mmol/L   Potassium 3.7 3.5 - 5.1 mmol/L   Chloride 107 98 - 111 mmol/L   CO2 21 (L) 22 - 32 mmol/L   Glucose, Bld 102 (H) 70 - 99 mg/dL    Comment: Glucose reference range applies only to samples taken after fasting for at least 8 hours.   BUN 10 6 - 20 mg/dL   Creatinine, Ser 9.30 0.44 - 1.00 mg/dL   Calcium 8.4 (L) 8.9 - 10.3 mg/dL   Total Protein 6.1 (L) 6.5 - 8.1 g/dL   Albumin  2.6 (L) 3.5 - 5.0 g/dL   AST 34 15 - 41 U/L   ALT 33 0 - 44 U/L   Alkaline Phosphatase 108 38 - 126 U/L   Total Bilirubin 0.5 0.0 - 1.2 mg/dL   GFR, Estimated >39 >39 mL/min    Comment: (NOTE) Calculated using the CKD-EPI Creatinine Equation (2021)    Anion gap 11 5 - 15    Comment: Performed at [facility], [address], [place], [place] [address]  Lactic acid, plasma     Status: None   Collection Time: 09/11/24  7:23 AM  Result Value Ref Range   Lactic Acid, Venous 0.7 0.5 - 1.9 mmol/L    Comment: Performed at [facility], [address]  [place], [place] [address]    CT SOFT TISSUE NECK W CONTRAST Result Date: 09/11/2024 CLINICAL DATA:  Severe pain after intubation, neck swelling EXAM: CT NECK WITH CONTRAST TECHNIQUE: Multidetector CT imaging of the neck was performed using the standard protocol following the bolus administration of intravenous contrast. RADIATION DOSE REDUCTION: This exam was performed according to the departmental dose-optimization program which includes automated exposure control, adjustment of the mA and/or kV according to patient size and/or use of iterative reconstruction technique. CONTRAST:  75mL OMNIPAQUE IOHEXOL 350 MG/ML SOLN COMPARISON:  None Available. FINDINGS: There is free air present in the retropharyngeal space and the carotid spaces and both sides of the neck. The free extends into the superior mediastinum. Pharynx: The nasopharynx, oropharynx and hypopharynx are normal Oral cavity/floor of mouth: Normal Larynx: Normal Salivary glands: The parotid and submandibular glands are normal Thyroid: Normal Lymph nodes: No adenopathy Vascular: No significant abnormality Limited intracranial: No significant abnormality Visualized orbits: No significant abnormality Mastoids and visualized paranasal sinuses: No significant abnormality Skeleton: No significant abnormality Upper chest: No significant abnormality Other: None IMPRESSION: Free air in the retropharyngeal space and carotid spaces in both sides of the neck. The air extends into the superior mediastinum. No hematoma. Electronically Signed   By: Nancyann Burns M.D.   On: 09/11/2024 11:27    ROS Blood pressure (!) 151/79, pulse 97, temperature 98 F (36.7 C), temperature source Oral, resp. rate 20, height 5' 2 (1.575 m), weight (!) 137.9 kg, last menstrual period 12/17/2023, SpO2 100%, unknown if currently breastfeeding. Physical Exam Constitutional:      Appearance: Normal appearance.  HENT:     Head: Normocephalic and atraumatic.     Right Ear: Tympanic  membrane is without lesions and middle ear aerated, ear canal and external ear normal.     Left Ear: Tympanic membrane is without lesions and middle ear aerated, ear canal and external ear normal.     Nose: Nose normal. Turbinates with mild hypertrophy, No significant swelling or masses.     Oral cavity/oropharynx: opens mouth  well and no evidence of blood or injury.Mucous membranes are moist. No lesions or masses    Larynx: normal voice. Mirror attempted without success    Eyes:     Extraocular Movements: Extraocular movements intact.     Conjunctiva/sclera: Conjunctivae normal.     Pupils: Pupils are equal, round, and reactive to light.  Cardiovascular:     Rate and Rhythm: Normal rate.  Pulmonary:     Effort: Pulmonary effort is normal.  Musculoskeletal:     Cervical back: Normal range of motion and neck supple. No rigidity.  Lymphadenopathy:     Cervical: there is fullness in right greater than left. No cervical adenopathy or masses.salivary glands without lesions. .  Neurological:     Mental Status: He is alert. CN 2-12 intact. No nystagmus      Assessment/Plan: Intubation injury- the patient has air in the retropharynx and mediastinum with no specific etiology. She would not allow a fibroptic scope./ no evidence of injury to the posterior wall by OP examine. I explained the benefit of knowing where the injury was but she would rather hold on the scope since she is so much better. I think antibiotics should be used with the air violation of the soft tissues. I will check her again tomorrow. Call me if there is a change or she decides to proceed with scope. [phone]  Carly Murray Notice 09/11/2024, 12:47 PM

## 2024-09-12 ENCOUNTER — Other Ambulatory Visit (HOSPITAL_COMMUNITY): Payer: Self-pay

## 2024-09-12 DIAGNOSIS — T884XXA Failed or difficult intubation, initial encounter: Secondary | ICD-10-CM | POA: Insufficient documentation

## 2024-09-12 MED ORDER — SENNOSIDES-DOCUSATE SODIUM 8.6-50 MG PO TABS
2.0000 | ORAL_TABLET | Freq: Every day | ORAL | 0 refills | Status: AC
  Filled 2024-09-12: qty 60, 30d supply, fill #0

## 2024-09-12 MED ORDER — MENTHOL 3 MG MT LOZG
1.0000 | LOZENGE | OROMUCOSAL | 0 refills | Status: AC | PRN
  Filled 2024-09-12: qty 27, 3d supply, fill #0

## 2024-09-12 MED ORDER — IBUPROFEN 600 MG PO TABS
600.0000 mg | ORAL_TABLET | Freq: Four times a day (QID) | ORAL | 0 refills | Status: AC | PRN
  Filled 2024-09-12: qty 30, 8d supply, fill #0

## 2024-09-12 MED ORDER — POTASSIUM CHLORIDE CRYS ER 20 MEQ PO TBCR
20.0000 meq | EXTENDED_RELEASE_TABLET | Freq: Every day | ORAL | 0 refills | Status: AC
  Filled 2024-09-12: qty 10, 10d supply, fill #0

## 2024-09-12 MED ORDER — FUROSEMIDE 20 MG PO TABS: 20.0000 mg | ORAL_TABLET | Freq: Every day | ORAL | Status: DC

## 2024-09-12 MED ORDER — OXYCODONE HCL 5 MG PO TABS
5.0000 mg | ORAL_TABLET | ORAL | 0 refills | Status: AC | PRN
  Filled 2024-09-12: qty 10, 2d supply, fill #0

## 2024-09-12 MED ORDER — IBUPROFEN 600 MG PO TABS
600.0000 mg | ORAL_TABLET | Freq: Four times a day (QID) | ORAL | Status: DC
  Administered 2024-09-12: 600 mg via ORAL
  Filled 2024-09-12: qty 1

## 2024-09-12 MED ORDER — ACETAMINOPHEN 500 MG PO TABS
1000.0000 mg | ORAL_TABLET | Freq: Four times a day (QID) | ORAL | Status: DC
  Administered 2024-09-12: 1000 mg via ORAL
  Filled 2024-09-12: qty 2

## 2024-09-12 MED ORDER — POTASSIUM CHLORIDE CRYS ER 20 MEQ PO TBCR
20.0000 meq | EXTENDED_RELEASE_TABLET | Freq: Every day | ORAL | Status: DC
  Administered 2024-09-12: 20 meq via ORAL
  Filled 2024-09-12: qty 1

## 2024-09-12 MED ORDER — FUROSEMIDE 20 MG PO TABS
20.0000 mg | ORAL_TABLET | Freq: Every day | ORAL | 0 refills | Status: AC
  Filled 2024-09-12: qty 10, 10d supply, fill #0

## 2024-09-12 MED ORDER — PHENOL 1.4 % MT LIQD
2.0000 | OROMUCOSAL | 0 refills | Status: AC | PRN
  Filled 2024-09-12: qty 177, fill #0

## 2024-09-12 MED ORDER — AMOXICILLIN-POT CLAVULANATE 875-125 MG PO TABS
1.0000 | ORAL_TABLET | Freq: Two times a day (BID) | ORAL | 0 refills | Status: AC
  Filled 2024-09-12: qty 14, 7d supply, fill #0

## 2024-09-12 NOTE — Progress Notes (Addendum)
 POSTPARTUM PROGRESS NOTE  POD #3  Subjective:  Carly Murray is a 38 y.o. [id number] s/p pLTCS at [redacted]w[redacted]d.  She reports she doing well. No acute events overnight. She reports she is doing well. She denies any problems with ambulating, voiding or po intake. Denies nausea or vomiting. She has passed flatus. Pain is well controlled.  Lochia is appropriate < period bleeding. Reports she feels ready to go home   Objective: Blood pressure 104/72, pulse 90, temperature 97.7 F (36.5 C), temperature source Oral, resp. rate 19, height 5' 2 (1.575 m), weight (!) 137.9 kg, last menstrual period 12/17/2023, SpO2 100%, unknown if currently breastfeeding.  Physical Exam:  General: alert, cooperative and no distress Neck: Full range of motion, nontender, palpable R swelling Chest: no respiratory distress, CTA bilaterally Heart:regular rate, distal pulses intact Abdomen: soft, nontender,  Uterine Fundus: firm, appropriately tender DVT Evaluation: bilateral mild calf swelling, no tenderness, no erythema Extremities: 2+ pitting edema to 1/2 tibia Skin: warm, dry; incision clean/dry/intact w/ provena in place  Recent Labs    09/10/24 0441 09/11/24 0723  HGB 10.4* 10.7*  HCT 31.0* 31.8*    Assessment/Plan: Carly Murray is a 38 y.o. [id number] s/p pLTCS at [redacted]w[redacted]d for FIL.  POD#3 - Doing welll; pain well controlled.  Routine postpartum care  OOB, ambulated  Ambulating well for VTE prophylaxis Hgb status: asymptomatic, stable, no intervention needed Contraception: undecided Feeding: breast feeding  #cHTN, BP well controlled overnight -Restarted Nifedipine  30 mg PO Daily yesterday, plan to continue -PO lasix  20 mg 11/27, IV 40 mg lasix  11/28-29, will restart PO Lasix  20 mg tomorrow for 7-10 day course for leg edema, CBC and CMP wnl   #Intubation injury -ENT following, and appreciate their recommendations -IV Unasyn  until tomorrow morning, if continued improvement will switch to 7 day course  Augmentin . -CBC tomorrow morning -Last dose Solumedrol 125mg  IV today (11/28-11/29). - May consider PO steroid taper -DC PO acetaminophen  solution and Toradol  for pain -Restart PO Acetaminophen  tablet and ibuprofen   Dispo: Plan for discharge tomorrow.   LOS: 4 days   Fairy Amy, MD [facility], PGY-1  09/12/2024, 7:12 AM  Attestation of Supervision of Resident: Evaluation and management procedures were performed by the learners: Family Medicine Resident under my supervision. I was immediately available for direct supervision, assistance and direction throughout this encounter.  I also confirm that I have verified the information documented in the resident's note, and that I have also personally reperformed the pertinent components of the physical exam and all of the medical decision making activities.  I have also made any necessary editorial changes.  Charlie DELENA Courts, MD [facility]

## 2024-09-12 NOTE — Progress Notes (Signed)
 Patient ID: Carly Murray, female   DOB: 04/16/1986, 38 y.o.   MRN: [id number]  She looks great. No pain. Moves head normally. Eating well without trouble. Opens mouth well.   Exam- neck soft and nontender  She looks great so I think home is ok today with Augmentin for a week. Follow up if starts with pain, fever, head turning issues, swallowing issues or anything else abnormal.

## 2024-09-14 ENCOUNTER — Ambulatory Visit

## 2024-09-14 VITALS — BP 144/104 | HR 87 | Ht 62.0 in | Wt 292.0 lb

## 2024-09-14 DIAGNOSIS — Z4889 Encounter for other specified surgical aftercare: Secondary | ICD-10-CM | POA: Insufficient documentation

## 2024-09-14 DIAGNOSIS — Z0130 Encounter for examination of blood pressure without abnormal findings: Secondary | ICD-10-CM | POA: Insufficient documentation

## 2024-09-14 DIAGNOSIS — I10 Essential (primary) hypertension: Secondary | ICD-10-CM

## 2024-09-14 MED ORDER — NIFEDIPINE ER OSMOTIC RELEASE 30 MG PO TB24: 30.0000 mg | ORAL_TABLET | Freq: Every morning | ORAL | 2 refills | Status: AC

## 2024-09-14 NOTE — Progress Notes (Signed)
 Subjective:  FREDERIKA HUKILL is a [id number] here for BP check.  She is 6 days postpartum following a primary cesarean section.    Hypertension ROS: Patient denies headache and visual changes   Objective:  BP (!) 144/104 (BP Location: Right Arm, Patient Position: Sitting, Cuff Size: Large)   Pulse 87   Ht 5' 2 (1.575 m)   Wt 292 lb (132.5 kg)   LMP 12/17/2023 (Exact Date)   Breastfeeding No   BMI 53.41 kg/m   Appearance alert, well appearing, and in no distress and oriented to person, place, and time.  Assessment:   Blood Pressure stable.   Plan:  Patient will continue with Procardia  and check her BP at home.  Will report elevated BP's > 140 and >90.   Erminio DELENA Rumps, RN    Subjective:     LACRESHA FUSILIER is a 38 y.o. female who presents to the clinic 6 weeks status post low uterine, transverse cesarean section. Pt reports incision is healing well.      Objective:    BP (!) 144/104 (BP Location: Right Arm, Patient Position: Sitting, Cuff Size: Large)   Pulse 87   Ht 5' 2 (1.575 m)   Wt 292 lb (132.5 kg)   LMP 12/17/2023 (Exact Date)   Breastfeeding No   BMI 53.41 kg/m  General:  alert, well appearing, in no apparent distress  Incision:   healing well, no drainage, no erythema, no hernia, no seroma, no swelling, no dehiscence, incision well approximated     Assessment:    Doing well postoperatively.   Plan:    1. Continue any current medications. 2. Wound care discussed. 3. Follow up: PP appointment.   Erminio DELENA Rumps, RN

## 2024-09-15 ENCOUNTER — Emergency Department (HOSPITAL_BASED_OUTPATIENT_CLINIC_OR_DEPARTMENT_OTHER): Admission: EM | Admit: 2024-09-15 | Discharge: 2024-09-15 | Disposition: A | Discharge status: Home / Self Care

## 2024-09-15 ENCOUNTER — Other Ambulatory Visit: Payer: Self-pay

## 2024-09-15 ENCOUNTER — Encounter (HOSPITAL_BASED_OUTPATIENT_CLINIC_OR_DEPARTMENT_OTHER): Payer: Self-pay

## 2024-09-15 DIAGNOSIS — R600 Localized edema: Secondary | ICD-10-CM | POA: Diagnosis not present

## 2024-09-15 DIAGNOSIS — D72829 Elevated white blood cell count, unspecified: Secondary | ICD-10-CM | POA: Diagnosis not present

## 2024-09-15 DIAGNOSIS — I159 Secondary hypertension, unspecified: Secondary | ICD-10-CM | POA: Diagnosis not present

## 2024-09-15 DIAGNOSIS — R519 Headache, unspecified: Secondary | ICD-10-CM | POA: Diagnosis present

## 2024-09-15 HISTORY — DX: Essential (primary) hypertension: I10

## 2024-09-15 LAB — URINALYSIS, W/ REFLEX TO CULTURE (INFECTION SUSPECTED)
Bilirubin Urine: NEGATIVE
Glucose, UA: NEGATIVE mg/dL
Ketones, ur: NEGATIVE mg/dL
Nitrite: NEGATIVE
Protein, ur: NEGATIVE mg/dL
Specific Gravity, Urine: 1.025 (ref 1.005–1.030)
pH: 6.5 (ref 5.0–8.0)

## 2024-09-15 LAB — COMPREHENSIVE METABOLIC PANEL WITH GFR
ALT: 31 U/L (ref 0–44)
AST: 24 U/L (ref 15–41)
Albumin: 3.6 g/dL (ref 3.5–5.0)
Alkaline Phosphatase: 124 U/L (ref 38–126)
Anion gap: 12 (ref 5–15)
BUN: 16 mg/dL (ref 6–20)
CO2: 22 mmol/L (ref 22–32)
Calcium: 8.9 mg/dL (ref 8.9–10.3)
Chloride: 105 mmol/L (ref 98–111)
Creatinine, Ser: 0.65 mg/dL (ref 0.44–1.00)
GFR, Estimated: >60 mL/min (ref >60)
Glucose, Bld: 77 mg/dL (ref 70–99)
Potassium: 4.2 mmol/L (ref 3.5–5.1)
Sodium: 139 mmol/L (ref 135–145)
Total Bilirubin: 0.3 mg/dL (ref 0.0–1.2)
Total Protein: 7.1 g/dL (ref 6.5–8.1)

## 2024-09-15 LAB — LACTATE DEHYDROGENASE: LDH: 207 U/L (ref 105–235)

## 2024-09-15 LAB — CBC WITH DIFFERENTIAL/PLATELET
Abs Immature Granulocytes: 0.24 K/uL — ABNORMAL HIGH (ref 0.00–0.07)
Basophils Absolute: 0.1 K/uL (ref 0.0–0.1)
Basophils Relative: 1 %
Eosinophils Absolute: 0.2 K/uL (ref 0.0–0.5)
Eosinophils Relative: 1 %
HCT: 33.3 % — ABNORMAL LOW (ref 36.0–46.0)
Hemoglobin: 11.1 g/dL — ABNORMAL LOW (ref 12.0–15.0)
Immature Granulocytes: 2 %
Lymphocytes Relative: 34 %
Lymphs Abs: 5.2 K/uL — ABNORMAL HIGH (ref 0.7–4.0)
MCH: 30.3 pg (ref 26.0–34.0)
MCHC: 33.3 g/dL (ref 30.0–36.0)
MCV: 91 fL (ref 80.0–100.0)
Monocytes Absolute: 0.9 K/uL (ref 0.1–1.0)
Monocytes Relative: 6 %
Neutro Abs: 8.7 K/uL — ABNORMAL HIGH (ref 1.7–7.7)
Neutrophils Relative %: 56 %
Platelets: 469 K/uL — ABNORMAL HIGH (ref 150–400)
RBC: 3.66 MIL/uL — ABNORMAL LOW (ref 3.87–5.11)
RDW: 13 % (ref 11.5–15.5)
WBC: 15.1 K/uL — ABNORMAL HIGH (ref 4.0–10.5)
nRBC: 0 % (ref 0.0–0.2)

## 2024-09-15 LAB — SURGICAL PATHOLOGY

## 2024-09-15 MED ORDER — NIFEDIPINE ER OSMOTIC RELEASE 30 MG PO TB24
30.0000 mg | ORAL_TABLET | Freq: Every morning | ORAL | Status: DC
  Filled 2024-09-15: qty 1

## 2024-09-15 MED ORDER — NIFEDIPINE ER OSMOTIC RELEASE 30 MG PO TB24
30.0000 mg | ORAL_TABLET | Freq: Once | ORAL | Status: AC
  Administered 2024-09-15: 30 mg via ORAL
  Filled 2024-09-15: qty 1

## 2024-09-15 MED ORDER — ACETAMINOPHEN 500 MG PO TABS
1000.0000 mg | ORAL_TABLET | Freq: Once | ORAL | Status: AC
  Administered 2024-09-15: 1000 mg via ORAL
  Filled 2024-09-15: qty 2

## 2024-09-15 NOTE — ED Notes (Signed)
 Unable to void at this time.

## 2024-09-15 NOTE — ED Provider Notes (Addendum)
 [facility] [facility] Provider Note   CSN: [id number] Arrival date & time: 09/15/24  1456     Patient presents with: Hypertension   HETHER Murray is a 38 y.o. female.   38 year old female presenting emergency department with elevated blood pressure.  Had recent C-section birth on 11/26.  Has a history of chronic hypotension was on nifedipine .  Has not had her blood pressure medications in 2 days.  Blood pressure noted to be elevated at home.  Having a mild headache that started gradually this morning.  Not having chest pain or shortness of breath.  Feels that her feet are still swollen.  No vision loss, facial droop, unilateral weakness.  Went to her OB/GYN yesterday and was told her incision was healing well   Hypertension       Prior to Admission medications   Medication Sig Start Date End Date Taking? Authorizing Provider  amoxicillin -clavulanate (AUGMENTIN) 875-125 MG tablet Take 1 tablet by mouth 2 (two) times daily for 7 days. Take first dose tonight 09/12/24 09/19/24  Carly Pac, MD  furosemide  (LASIX ) 20 MG tablet Take 1 tablet (20 mg total) by mouth daily for 10 days. 09/13/24 09/23/24  Carly Pac, MD  ibuprofen  (ADVIL ) 600 MG tablet Take 1 tablet (600 mg total) by mouth every 6 (six) hours as needed for mild pain (pain score 1-3) or moderate pain (pain score 4-6). 09/12/24   Carly Pac, MD  menthol  (CEPACOL) 3 MG lozenge Take 1 lozenge (3 mg total) by mouth every 2 (two) hours as needed for sore throat. Patient not taking: Reported on 09/14/2024 09/12/24   Murray, Joseph, MD  NIFEdipine  (PROCARDIA -XL/NIFEDICAL-XL) 30 MG 24 hr tablet Take 1 tablet (30 mg total) by mouth every morning. 09/14/24   Carly Lynwood MATSU, MD  oxyCODONE  (OXY IR/ROXICODONE ) 5 MG immediate release tablet Take 1 tablet (5 mg total) by mouth every 4 (four) hours as needed for severe pain (pain score 7-10). 09/12/24   Murray, Joseph, MD  phenol  (CHLORASEPTIC) 1.4 % LIQD Use as directed 2 sprays in the mouth or throat as needed for throat irritation / pain. 09/12/24   Carly Pac, MD  potassium chloride  SA (KLOR-CON  M) 20 MEQ tablet Take 1 tablet (20 mEq total) by mouth daily for 10 days. 09/13/24 09/23/24  Carly Pac, MD  Prenatal Vit-Fe Fumarate-FA (PRENATAL VITAMINS PO) Take 1 capsule by mouth daily.    [provider]  senna-docusate (SENOKOT-S) 8.6-50 MG tablet Take 2 tablets by mouth daily. 09/12/24   Carly Pac, MD    Allergies: Patient has no known allergies.    Review of Systems  Updated Vital Signs BP (!) 163/97 (BP Location: Right Arm) Comment: Provider aware  Pulse 66   Temp 98.5 F (36.9 C) (Oral)   Resp 17   Ht 5' 2 (1.575 m)   Wt 132.5 kg   LMP 12/17/2023 (Exact Date)   SpO2 100%   BMI 53.43 kg/m   Physical Exam Vitals and nursing note reviewed.  Constitutional:      General: She is not in acute distress.    Appearance: She is obese. She is not toxic-appearing.  HENT:     Head: Normocephalic.     Nose: Nose normal.     Mouth/Throat:     Mouth: Mucous membranes are moist.  Eyes:     Conjunctiva/sclera: Conjunctivae normal.  Cardiovascular:     Rate and Rhythm: Normal rate.  Pulmonary:     Effort:  Pulmonary effort is normal.  Abdominal:     General: Abdomen is flat. There is no distension.     Tenderness: There is no abdominal tenderness. There is no guarding or rebound.  Musculoskeletal:     Right lower leg: Edema present.     Left lower leg: Edema present.  Skin:    General: Skin is warm and dry.     Capillary Refill: Capillary refill takes less than 2 seconds.  Neurological:     General: No focal deficit present.     Mental Status: She is alert and oriented to person, place, and time.  Psychiatric:        Mood and Affect: Mood normal.        Behavior: Behavior normal.     (all labs ordered are listed, but only abnormal results are displayed) Labs Reviewed  CBC  WITH DIFFERENTIAL/PLATELET - Abnormal; Notable for the following components:      Result Value   WBC 15.1 (*)    RBC 3.66 (*)    Hemoglobin 11.1 (*)    HCT 33.3 (*)    Platelets 469 (*)    Neutro Abs 8.7 (*)    Lymphs Abs 5.2 (*)    Abs Immature Granulocytes 0.24 (*)    All other components within normal limits  URINALYSIS, W/ REFLEX TO CULTURE (INFECTION SUSPECTED) - Abnormal; Notable for the following components:   Hgb urine dipstick LARGE (*)    Leukocytes,Ua TRACE (*)    Bacteria, UA FEW (*)    All other components within normal limits  COMPREHENSIVE METABOLIC PANEL WITH GFR  LACTATE DEHYDROGENASE    EKG: EKG Interpretation Date/Time:  Tuesday September 15 2024 15:22:34 EST Ventricular Rate:  75 PR Interval:  185 QRS Duration:  90 QT Interval:  374 QTC Calculation: 418 R Axis:   -22  Text Interpretation: Sinus rhythm Borderline left axis deviation Consider anterior infarct Confirmed by Carly Murray [phone]) on 09/15/2024 3:46:06 PM  Radiology: No results found.   Procedures   Medications Ordered in the ED  NIFEdipine  (PROCARDIA -XL/NIFEDICAL-XL) 24 hr tablet 30 mg (30 mg Oral Given 09/15/24 1545)  acetaminophen  (TYLENOL ) tablet 1,000 mg (1,000 mg Oral Given 09/15/24 1605)    Clinical Course as of 09/15/24 1649  Tue Sep 15, 2024  1522 Per chart review appears patient has a history of chronic hypertension.   Per MAU note on 11/16 :   Assessment: Carly Murray is a 38 y.o. female G2P0101 with IUP at [redacted]w[redacted]d by LMP presenting for elevated blood pressure and distal extremity tingling.    Blood pressure overall better-controlled than prior visit, has been mildly elevated but low concern for pre-eclampsia considering normal Pr:Cr, CMP and platelets. WBC is elevated at 16 with neutrophilia, but has been mildly elevated over past 3 months only slightly increased from 4 days ago, no fever or other symptoms and unremarkable UA are reassuring against infectious process,  likely reactive leukocytosis in the setting of pregnancy.    Plan: - Discharge to home in stable condition. - Continue nifedipine  BID, home BP monitoring. - Continue supervision of high risk pregnancy as scheduled. - F/u 11/20. [TY]  1541 CBC with Differential(!) Leukocytosis noted, but downtrending compared to prior.  No fever or tachycardia to suggest overt infection.  No thrombocytopenia [TY]  1613 Creatinine: 0.65 [TY]  1642 Urinalysis, w/ Reflex to Culture (Infection Suspected) -Urine, Clean Catch(!) No protein in urine [TY]  1642 Comprehensive metabolic panel No elevation in her LFTs to suggest  HELLP syndrome [TY]  1642 Patient's workup reassuring.  She reported her headache has improved after Tylenol .  Was given a dose of her nifedipine .  She reports that her refill is ready at the pharmacy now and she can pick it up.  It appears her elevated blood pressure is secondary to not having her chronic antihypertensive rather than postpartum preeclampsia.  Discussed keeping logs of her blood pressure and following up with primary doctor.  Was  given return precautions.  Will discharge in stable condition. [TY]  [id number] Spoke with OB/GYN who agrees likely not postpartum preeclampsia.  Recommending increasing Procardia  60 mg in the morning 30 in the afternoon.  Will have office schedule appointment for repeat blood pressure check in the next few days. [TY]    Clinical Course User Index [TY] Carly Caron PARAS, DO                                 Medical Decision Making This well-appearing 38 year old female presenting emergency department with elevated blood pressure.  She is afebrile nontachycardic is hypertensive 158/66 here.  Does have a history of hypertension and obesity.  Per chart review is on nifedipine .  She notes he typically does not have headache with some ibuprofen  earlier.  Will give Tylenol .  She has no localizing neurodeficits or vision changes.  Will get screening labs and urine to  check for proteinuria.  Will give home dose of her nifedipine .  See ED course for further MDM and final disposition.  Amount and/or Complexity of Data Reviewed Labs:  Decision-making details documented in ED Course.  Risk OTC drugs. Prescription drug management. Decision regarding hospitalization. Diagnosis or treatment significantly limited by social determinants of health.      Final diagnoses:  Secondary hypertension    ED Discharge Orders     None          Carly Caron PARAS, DO 09/15/24 1643    Carly Caron PARAS, DO 09/15/24 1650

## 2024-09-15 NOTE — ED Triage Notes (Signed)
 Reports elevated BP X 2 days. Reports having c-section birth on 11/26. Also reports hx of BP & has been out of BP meds since 11/29.  Endorses headache

## 2024-09-15 NOTE — ED Notes (Signed)
 Patient transferred from waiting room to ED treatment room. Assuming pt care at this time.

## 2024-09-15 NOTE — ED Notes (Signed)
 ED Provider at bedside.

## 2024-09-15 NOTE — Discharge Instructions (Signed)
 Please pick up and take your blood pressure medications.  Return for severe headache, vision changes, unilateral weakness, seizures, chest pain, shortness of breath, fevers, severe abdominal pain, uncontrolled vaginal bleeding or any new or worsening symptoms that are concerning to you.

## 2024-09-18 ENCOUNTER — Ambulatory Visit

## 2024-09-18 VITALS — BP 130/77 | HR 86 | Ht 62.0 in | Wt 281.0 lb

## 2024-09-18 DIAGNOSIS — Z0130 Encounter for examination of blood pressure without abnormal findings: Secondary | ICD-10-CM | POA: Diagnosis not present

## 2024-09-18 NOTE — Progress Notes (Signed)
 Subjective:  Carly Murray is a [id number] here for BP check.  She is 11 days postpartum following a primary cesarean section.    Hypertension ROS: Patient denies headache and visual changes   Objective:  BP 130/77 (BP Location: Right Arm, Patient Position: Sitting)   Pulse 86   Ht 5' 2 (1.575 m)   Wt 281 lb (127.5 kg)   LMP 12/17/2023 (Exact Date)   BMI 51.40 kg/m   Appearance alert, well appearing, and in no distress and oriented to person, place, and time.  Assessment:   Blood Pressure stable.   Plan:  Current treatment plan is effective, no change in therapy. BP's reviewed by Dr. Herchel.  Keep medication the same.   Erminio DELENA Rumps, RN

## 2024-09-23 ENCOUNTER — Telehealth: Payer: Self-pay

## 2024-09-23 NOTE — Telephone Encounter (Signed)
 [facility] Nurse called stating patient's reparations were 28 during her home visit today. She states she checked patient's respirations multiple times.    Called patient. Patient states that she feels fine. She states that this is normal for her when the weather changes. Advised patient to go to MAU to be seen if starts to feel dizziness or tightness in her chest. Understanding was voiced. Krue Peterka l [place], CMA

## 2024-09-29 ENCOUNTER — Inpatient Hospital Stay (HOSPITAL_COMMUNITY): Admit: 2024-09-29

## 2024-10-02 ENCOUNTER — Other Ambulatory Visit: Payer: Self-pay | Admitting: Obstetrics & Gynecology

## 2024-10-02 MED ORDER — AMLODIPINE BESYLATE 5 MG PO TABS: 5.0000 mg | ORAL_TABLET | Freq: Every day | ORAL | 2 refills | Status: DC

## 2024-10-02 NOTE — Progress Notes (Signed)
 Meds ordered this encounter  Medications   amLODipine  (NORVASC ) 5 MG tablet    Sig: Take 1 tablet (5 mg total) by mouth daily.    Dispense:  30 tablet    Refill:  2

## 2024-10-20 ENCOUNTER — Ambulatory Visit

## 2024-10-20 ENCOUNTER — Other Ambulatory Visit (HOSPITAL_BASED_OUTPATIENT_CLINIC_OR_DEPARTMENT_OTHER): Payer: Self-pay

## 2024-10-20 DIAGNOSIS — I10 Essential (primary) hypertension: Secondary | ICD-10-CM | POA: Diagnosis not present

## 2024-10-20 DIAGNOSIS — L732 Hidradenitis suppurativa: Secondary | ICD-10-CM | POA: Diagnosis not present

## 2024-10-20 DIAGNOSIS — Z4889 Encounter for other specified surgical aftercare: Secondary | ICD-10-CM | POA: Diagnosis not present

## 2024-10-20 MED ORDER — CEFADROXIL 500 MG PO CAPS
500.0000 mg | ORAL_CAPSULE | Freq: Two times a day (BID) | ORAL | 0 refills | Status: AC
  Filled 2024-10-20: qty 10, 5d supply, fill #0

## 2024-10-20 MED ORDER — NORETHINDRONE 0.35 MG PO TABS
1.0000 | ORAL_TABLET | Freq: Every day | ORAL | 3 refills | Status: AC
  Filled 2024-10-20: qty 84, 84d supply, fill #0

## 2024-10-20 MED ORDER — AMLODIPINE BESYLATE 10 MG PO TABS
10.0000 mg | ORAL_TABLET | Freq: Every day | ORAL | 1 refills | Status: AC
  Filled 2024-10-20: qty 60, 60d supply, fill #0

## 2024-10-20 NOTE — Patient Instructions (Signed)

## 2024-10-20 NOTE — Progress Notes (Unsigned)
 "   Post Partum Visit Note  Carly Murray is a 39 y.o. G81P1102 female who presents for a postpartum visit. She is 6 weeks postpartum following a primary cesarean section.  I have fully reviewed the prenatal and intrapartum course. The delivery was at 37 gestational weeks.  Anesthesia: epidural. Postpartum course has been uncomplicated. Baby is doing well and is now 7lbs. Baby is feeding by bottle - Similac Soy. Bleeding no bleeding. Bowel function is normal. Bladder function is normal. Patient is not sexually active. Contraception method is OCP (estrogen/progesterone). Postpartum depression screening: negative.  She denies feelings of depression.  She reports support with infant care from FOB and his family as well as her mother and older son.    She reports adequate sleep. She denies issues with her breast.  Patient reports having boils near incisional scar that she has had in the past, but has increased in frequency since delivery. She denies pain currently, but reports pain yesterday.  She reports she has not had a cycle yet, but had continuous bleeding for 4 weeks.   She reports she has not been on birth control in 12 years, but will like to try pills. She has tried depo in the past but had weight gain and mood swings so discontinued.    The pregnancy intention screening data noted above was reviewed. Potential methods of contraception were discussed. The patient elected to proceed with No data recorded.     Health Maintenance Due  Topic Date Due   Hepatitis B Vaccines 19-59 Average Risk (1 of 3 - 19+ 3-dose series) Never done   HPV VACCINES (1 - 3-dose SCDM series) Never done   Influenza Vaccine  Never done   COVID-19 Vaccine (1 - 2025-26 season) Never done      10/16/2024    9:17 AM 09/12/2024    9:00 AM 09/11/2024    8:42 AM  Edinburgh Postnatal Depression Scale Screening Tool  I have been able to laugh and see the funny side of things. 0  0 --  I have looked forward with  enjoyment to things. 0  0   I have blamed myself unnecessarily when things went wrong. 1  1   I have been anxious or worried for no good reason. 0  0   I have felt scared or panicky for no good reason. 0  0   Things have been getting on top of me. 0  0   I have been so unhappy that I have had difficulty sleeping. 0  0   I have felt sad or miserable. 1  0   I have been so unhappy that I have been crying. 1  0   The thought of harming myself has occurred to me. 0  0   Edinburgh Postnatal Depression Scale Total 3  1      Manually entered by patient       The following portions of the patient's history were reviewed and updated as appropriate: allergies, current medications, past family history, past medical history, past social history, past surgical history, and problem list.  Review of Systems Pertinent items are noted in HPI.  Objective:  BP (!) 150/100 (BP Location: Right Arm, Patient Position: Sitting, Cuff Size: Large)   Pulse 83   Wt 285 lb 1.3 oz (129.3 kg)   LMP 12/17/2023 (Exact Date)   Breastfeeding No   BMI 52.14 kg/m    General:  alert, cooperative, and no distress  Breasts:  Not assessed  Lungs: clear to auscultation bilaterally  Heart:  regular rate and rhythm  Abdomen: Soft, NT. Peau de orange appearance above incision.  No warmth or tenderness. Small 1x1 wound on lower right side filled with greenish white pus-type fluid. No active drainage from area.    Wound  Well approximated incision  GU exam:  not indicated       Assessment:   Postpartum Care & Examination S/P C/S Wound Check CHTN  Plan:   -Exam performed and findings discussed.   -Reviewed how findings are suggestive of hidradenitis suppurativa. -Suggestive dermatology consult and patient agreeable. Referral placed and information placed in AVS.  -Further suggest short course of antibiotics to avoid infection. Patient agreeable. -Educated on how antibiotics could reduce efficacy of oral  contraception.  -Rx for [employer] sent.  -Educated on appropriateness of COCs with uncontrolled bps.  Discussed alternative options including POPs, IUD, and Mirena.  Patient desires POPs. Will send script. -RTO yearly for annual exam or as needed.  -Problem list reviewed and resolution of problems as appropriate.   Essential components of care per ACOG recommendations:  1.  Mood and well being: Patient with negative depression screening today. Reviewed local resources for support.  - Patient tobacco use? No.   - hx of drug use? No.    2. Infant care and feeding:  -Patient currently breastmilk feeding? No.  -Social determinants of health (SDOH) reviewed in EPIC. No concerns  3. Sexuality, contraception and birth spacing - Patient does not want a pregnancy in the next year.  Desired family size is 2 children.  - Reviewed reproductive life planning. Reviewed contraceptive methods based on pt preferences and effectiveness.  Patient desired Oral Contraceptive today.   - Discussed birth spacing of 18 months  4. Sleep and fatigue -Encouraged family/partner/community support of 4 hrs of uninterrupted sleep to help with mood and fatigue  5. Physical Recovery  - Discussed patients delivery and complications. She describes her labor as Traumatized.  She reports she had to go under general anesthesia and experienced difficulty with intubation and woke up with throat swelling.  She states she was in the hospital for ~ 5 days.  - Patient had a C-section emergent. Patient had no laceration. Perineal healing reviewed. Patient expressed understanding - Patient has urinary incontinence? No. - Patient is safe to resume physical and sexual activity **Offered counseling regarding delivery trauma and patient declines currently.   6.  Health Maintenance - HM due items addressed Yes - Last pap smear  Diagnosis  Date Value Ref Range Status  03/26/2024   Final   - Negative for intraepithelial lesion or  malignancy (NILM)   Pap smear not done at today's visit.  -Breast Cancer screening indicated? No.   7. Chronic Disease/Pregnancy Condition follow up: Hypertension  -BP elevated today, but no medication since yesterday d/t pharmacy availability. -Discussed need to follow up with PCP soon and patient reports seeing Bernardino Pinal at [facility] and has an appt on Monday.  -Informed that medication dosage will be increased and new script to be sent. Encouraged to pick up today. Patient agreeable to new plan and to provider send script to [facility] pharmacy.   Karsen Fellows l Tresten Pantoja, CMA [facility] [employer], [facility]   Harlene LITTIE Duncans MSN, CNM Advanced Practice Provider, [facility] [employer]   "

## 2024-10-21 ENCOUNTER — Ambulatory Visit (INDEPENDENT_AMBULATORY_CARE_PROVIDER_SITE_OTHER)

## 2024-10-21 ENCOUNTER — Other Ambulatory Visit: Payer: Self-pay

## 2024-10-21 VITALS — BP 158/98 | Ht 62.0 in | Wt 285.0 lb

## 2024-10-21 DIAGNOSIS — M654 Radial styloid tenosynovitis [de Quervain]: Secondary | ICD-10-CM

## 2024-10-21 MED ORDER — METHYLPREDNISOLONE ACETATE 40 MG/ML IJ SUSP
40.0000 mg | Freq: Once | INTRAMUSCULAR | Status: AC
  Administered 2024-10-21: 20 mg via INTRA_ARTICULAR

## 2024-10-21 NOTE — Progress Notes (Signed)
" ° °  Subjective:    Patient ID: Carly Murray, female    DOB: 39 y.o., [birth year]   MRN: [id number]  Chief Complaint: Bilateral wrist pain  Discussed the use of AI scribe software for clinical note transcription with the patient, who gave verbal consent to proceed.  History of Present Illness Positive fingerEbonee S Murray is a 39 year old female with carpal tunnel syndrome who presents with severe bilateral wrist pain postpartum.  Wrist pain and functional limitation - Severe, constant pain localized to the radial aspect of both wrists since cesarean section on September 09, 2024 - Pain significantly limits daily activities, including holding her seven-pound infant  Carpal tunnel syndrome symptoms - Several years of carpal tunnel symptoms, previously managed with nighttime wrist bracing - Symptoms worsened slightly during pregnancy but remained tolerable until postpartum period - Occasional nocturnal numbness and paresthesias in fingers - No radiation of pain or dysesthesias with wrist movement  Prior treatments - Nighttime wrist bracing used for symptom management, including during pregnancy - No history of steroid injections or physical therapy    Review of Pertinent Imaging: None available    Objective:   Vitals:   10/21/24 1032  BP: (!) 158/98    Bilateral wrists: No overlying skin changes.  No deformity. Negative Tinel's.  Negative Durkan/Phalen's. Positive Finkelstein's, Eickhoff, WHAT test   Right wrist De Quervain's Injection with Ultrasound Guidance Carly Murray [birth year] Indications: Pain Procedure Details Following the description of risks including infection bleeding, damage to surrounding structures, atrophy, and hypo-pigmentation, patient provided written consent for Right dequervain sheath corticosteroid injection procedure with ultrasound guidance. Palpated for for the dequervain sheath. US  was then used to identify the first dorsal compartment.  Patient was sterilely prepped in the usual fashion with alcohol. Sterile US  gel was used for the procedure. Following topical anesthetization with ethyl chloride, the tendon sheath was injected with a solution of 20mg  depo-medrol , 1 cc 2% Mepivicaine, 0.25cc Sodium Bicarbonate 8.4%. This was well visualized under ultrasound, please see associated photographic documentation.  Patient tolerated well without complication.  Precautions provided.     Assessment & Plan:   Assessment & Plan de Quervain's tenosynovitis She experiences severe bilateral wrist pain postpartum, consistent with de Quervain's tenosynovitis, causing significant functional impairment. Symptoms are localized to the radial aspect of both wrists and worsen with thumb movement. Carpal tunnel syndrome is unlikely due to negative provocative testing and minimal paresthesia. A steroid injection was chosen for rapid relief, as there are no contraindications; she is not breastfeeding and has no glycemic concerns. An ultrasound-guided corticosteroid injection was performed on the right wrist. Home exercise instructions for wrist rehabilitation were provided. A referral to hand therapy for at least one session is recommended. Wrist brace use is deferred and will be considered if symptoms persist post-injection. A follow-up injection is arranged for the contralateral side.   "

## 2024-10-22 ENCOUNTER — Encounter: Payer: Self-pay | Admitting: Family Medicine

## 2024-10-22 ENCOUNTER — Other Ambulatory Visit: Payer: Self-pay | Admitting: *Deleted

## 2024-10-22 ENCOUNTER — Other Ambulatory Visit: Payer: Self-pay

## 2024-10-22 ENCOUNTER — Ambulatory Visit (INDEPENDENT_AMBULATORY_CARE_PROVIDER_SITE_OTHER)

## 2024-10-22 VITALS — BP 150/100

## 2024-10-22 DIAGNOSIS — M654 Radial styloid tenosynovitis [de Quervain]: Secondary | ICD-10-CM | POA: Diagnosis present

## 2024-10-22 DIAGNOSIS — R200 Anesthesia of skin: Secondary | ICD-10-CM

## 2024-10-22 DIAGNOSIS — R202 Paresthesia of skin: Secondary | ICD-10-CM | POA: Diagnosis not present

## 2024-10-22 MED ORDER — METHYLPREDNISOLONE ACETATE 40 MG/ML IJ SUSP
40.0000 mg | Freq: Once | INTRAMUSCULAR | Status: AC
  Administered 2024-10-22: 20 mg via INTRA_ARTICULAR

## 2024-10-22 NOTE — Addendum Note (Signed)
 Addended by: MARCINE HARLENE SAILOR on: 10/22/2024 11:43 AM   Modules accepted: Orders

## 2024-10-22 NOTE — Progress Notes (Signed)
"                ° °  Subjective:    Patient ID: Carly Murray, female    DOB: 39 y.o., 12-19-1985   MRN: [id number]  Chief Complaint: Left Everitt Quervain's Injection  History of Present Illness Carly Murray is a 39 year old female with bilateral carpal tunnel syndrome and left de Quervain's tenosynovitis who presents for evaluation of persistent bilateral hand symptoms.  Bilateral hand paresthesia and discomfort - Persistent paresthesia and discomfort in both hands consistent with carpal tunnel syndrome. - Symptoms have not improved with nightly wrist braces or other conservative treatments. - No change in symptoms compared to last year.  Therapeutic interventions - Received corticosteroid injection in right wrist yesterday. - Receiving corticosteroid injection in left wrist at today's visit.  Objective:   Left De Quervain's Injection with Ultrasound Guidance Carly Murray 02/22/1986 Indications: Pain Procedure Details Following the description of risks including infection bleeding, damage to surrounding structures, atrophy, and hypo-pigmentation, patient provided written consent for left dequervain sheath corticosteroid injection procedure with ultrasound guidance. Palpated for for the dequervain sheath. US  was then used to identify the first dorsal compartment. Patient was sterilely prepped in the usual fashion with alcohol. Sterile US  gel was used for the procedure. Following topical anesthetization with ethyl chloride, the tendon sheath was injected with a solution of 20mg  depo-medrol , 1 cc 2% Mepivicaine, 0.25cc Sodium Bicarbonate 8.4%. This was well visualized under ultrasound, please see associated photographic documentation.  Patient tolerated well without complication.  Precautions provided.  Limited US  of the bilateral wrists: Left median nerve cross-sectional area 13 mm  Right median nerve cross-sectional area 14 mm  Interpretation: Mild to moderate carpal tunnel  syndrome on the left Moderate to severe carpal tunnel syndrome on the right    Assessment & Plan:   Assessment & Plan Left-sided de Quervain's tenosynovitis Patient tolerated the injection today well.  Recommend work with hand therapy and follow-up as needed.  Bilateral carpal tunnel syndrome Ultrasound confirmed bilateral median nerve enlargement, with mild to moderate severity on the left and moderate to severe on the right. Symptoms are refractory to night bracing. She is not a surgical candidate and prefers to avoid surgery. Repeated injections and surgical options were discussed for refractory cases. Performed ultrasound-guided hydrodissection with sugar water  solution to the left wrist to cushion the median nerve and decompress the carpal tunnel. Advised continued use of night bracing for both wrists. Instructed to avoid soaking the injected wrist for 24 hours; showering is permitted. Scheduled follow-up in three weeks to reassess symptoms and response to injection. Discussed option for injection to the right wrist if symptoms persist after three weeks of bracing and initial injection. Reviewed surgical options, including traditional open release and minimally invasive thread technique, as future considerations if symptoms remain refractory after repeated injections.   "

## 2024-10-29 NOTE — Therapy (Signed)
 " OUTPATIENT OCCUPATIONAL THERAPY ORTHO EVALUATION  Patient Name: Carly Murray MRN: [id number] DOB:1985/12/25, 39 y.o., female Today's Date: 10/30/2024  PCP: Bernardino Pinal, PA REFERRING PROVIDER: Dr. Redell Robes  END OF SESSION:  OT End of Session - 10/30/24 0839     Visit Number 1    Number of Visits 13    Date for Recertification  12/14/24    Authorization Type [employer]--awaiting [place]    OT Start Time [phone]    OT Stop Time 0920    OT Time Calculation (min) 44 min    Activity Tolerance Patient tolerated treatment well    Behavior During Therapy [facility] for tasks assessed/performed          Past Medical History:  Diagnosis Date   Hypertension    Obesity    Recurrent infection of skin    Past Surgical History:  Procedure Laterality Date   CESAREAN SECTION N/A 09/09/2024   Procedure: CESAREAN DELIVERY;  Surgeon: Jayne Vonn DEL, MD;  Location: [facility] LD ORS;  Service: Obstetrics;  Laterality: N/A;   KNEE SURGERY     Patient Active Problem List   Diagnosis Date Noted   Encounter for post surgical wound check 09/14/2024   Difficult intubation 09/12/2024   Status post primary low transverse cesarean section 09/11/2024   Chronic hypertension affecting pregnancy 07/16/2024   BMI 45.0-49.9, adult (HCC) 07/16/2024   Prediabetes 04/23/2024   Moderate dysplasia of cervix 01/12/2015    ONSET DATE: 10/22/24 (referral date)  REFERRING DIAG: M65.4 (ICD-10-CM) - De Quervain's tenosynovitis, left M65.4 (ICD-10-CM) - De Quervain's tenosynovitis, right  THERAPY DIAG:  Pain in left hand  Pain in right hand  Muscle weakness (generalized)  De Quervain's tenosynovitis, left  De Quervain's tenosynovitis, right  Rationale for Evaluation and Treatment: Rehabilitation  SUBJECTIVE:   SUBJECTIVE STATEMENT: Pt reports pain in both wrist, L>R, improved since injections approx a week ago.  Pt reports began during pregnancy and worsened after giving birth.  Hx of CTS symptoms  prior to pregnancy, but easy to maintain.    Pt accompanied by: self  PERTINENT HISTORY:   Per 10/23/23 MD note:  Carly Murray is a 39 year old female with bilateral carpal tunnel syndrome and left de Quervain's tenosynovitis who presents for evaluation of persistent bilateral hand symptoms.   Bilateral hand paresthesia and discomfort - Persistent paresthesia and discomfort in both hands consistent with carpal tunnel syndrome. - Symptoms have not improved with nightly wrist braces or other conservative treatments. - No change in symptoms compared to last year.   Therapeutic interventions - Received corticosteroid injection in right wrist yesterday. - Receiving corticosteroid injection in left wrist at today's visit.  PRECAUTIONS: None  RED FLAGS: None   WEIGHT BEARING RESTRICTIONS: No  PAIN:  Are you having pain? Yes: NPRS scale: 0-4/10 Pain location: both wrists at base of thumb/radial wrist Pain description: sharp and ache Aggravating factors: holding/gripping things (plate) Relieving factors: rest    FALLS: Has patient fallen in last 6 months? No  LIVING ENVIRONMENT: Lives with: lives with their family and significant other (2 sons--16 y.o. and 73 weeks old) Lives in:  apartment--1 flight to get into  PLOF: Independent  PATIENT GOALS: improve pain, keep it from worsening again  NEXT MD VISIT: 11/13/23  OBJECTIVE:  Note: Objective measures were completed at Evaluation unless otherwise noted.  HAND DOMINANCE: Right  ADLs: Overall ADLs: Pt reports difficulty and pain with gripping/holding items, opening jar, carrying bags (significant other carries  car seat), cutting food, holding 41 week old baby.   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  Has night splints that she wears intermittently.  Hx of symptoms prior to pregnancy as well.   EDEMA: none noted today, noted tightness along L radial forearm but pt denies tenderness  COGNITION: Overall cognitive status: Within functional limits for tasks assessed  OBSERVATIONS: Pleasant 39 y.o. female with bilateral hand/wrist pain during recent pregnancy and after birth (baby currently 78 weeks old).  Pt reports significant pain, but improved significantly since injections last week.     TREATMENT DATE:  10/31/23  evaluation completed; also see education section for education provided.                                                                                                                             PATIENT EDUCATION: Education details: OT eval results and POC; Positions to avoid/basic activity modification for lifting/carrying objects (as well as basic positioning of baby for feeding/carrying); initial HEP--see pt instructions; Recommended forearm-based thumb spica splint for aggravating/repetitive activities (particularly for L hand)--pt shown picture of what to look for at store/online Person educated: Patient Education method: Explanation, Demonstration, Verbal cues, and Handouts Education comprehension: verbalized understanding, returned demonstration, verbal cues required, and needs further education  HOME EXERCISE PROGRAM: 10/31/23:   -Positions to avoid/basic activity modification for lifting/carrying objects (as well as basic positioning of baby for feeding/carrying) -Initial HEP -Recommended forearm-based thumb spica splint for aggravating/repetitive activities (particularly for L hand)--pt shown picture of what to look for at store/online  GOALS: Goals reviewed with patient? Yes  SHORT TERM GOALS: Target date:  11/29/24  Pt will be independent with initial HEP for stretching/ROM. Baseline:  needs education, no HEP Goal status: INITIAL  2.  Pt will verbalize understanding of AE/activity modifications for decr pain for ADLs/IADLs (including carrying for baby). Baseline:  needs education Goal status: INITIAL  3.  Pt will report pain in bilateral hands/wrist consistently less than or equal to 2/10 for ADLs over at least 1 week. Baseline: 0-4/10 pain (significantly improved over last week) Goal status: INITIAL   LONG TERM GOALS: Target date: 12/14/24  Pt will be independent with updated strengthening HEP. Baseline:  needs education, no HEP Goal status: INITIAL  2.  Pt will improve bilateral hand functional use and decr pain for functional tasks as shown by improving score on Quick Dash to 6% or less. Baseline: 11.4% Goal status: INITIAL  3.  Pt will demo at least 36lbs L grip strength to assist with lifting/carriyng tasks. Baseline: 26lbs Goal status: INITIAL   ASSESSMENT:  CLINICAL IMPRESSION: Patient is a 39 y.o. female who was seen today for occupational therapy evaluation for bilateral de Quervain's tenosynovitis.  Pt has hx of bilateral CTS as well.  Pt reports significant pain in bilateral hands/wrist during pregnancy and since birth, but improved since injections last week.  Pt is currently on maternity leave.  She reports difficulty/pain with gripping tasks.  Pt presents with pain and weakness.  Pt would benefit from occupational therapy to improve pain and weakness and decr risk of future complications.    PERFORMANCE DEFICITS: in functional skills including ADLs, IADLs, strength, pain, decreased knowledge of precautions, decreased knowledge of use of DME, and UE functional use,  and psychosocial skills including environmental adaptation, habits, and routines and behaviors.   IMPAIRMENTS: are limiting patient from ADLs, IADLs, work, and leisure.   COMORBIDITIES: may have  co-morbidities  that affects occupational performance. Patient will benefit from skilled OT to address above impairments and improve overall function.  MODIFICATION OR ASSISTANCE TO COMPLETE EVALUATION: No modification of tasks or assist necessary to complete an evaluation.  OT OCCUPATIONAL PROFILE AND HISTORY: Detailed assessment: Review of records and additional review of physical, cognitive, psychosocial history related to current functional performance.  CLINICAL DECISION MAKING: LOW - limited treatment options, no task modification necessary  REHAB POTENTIAL: Good  EVALUATION COMPLEXITY: Low      PLAN:  OT FREQUENCY: 2x/week  OT DURATION: 6 weeks +eval   PLANNED INTERVENTIONS: 97535 self care/ADL training, 02889 therapeutic exercise, 97530 therapeutic activity, 97112 neuromuscular re-education, 97140 manual therapy, 97035 ultrasound, 97018 paraffin, 02989 moist heat, 97010 cryotherapy, 97034 contrast bath, 97760 Orthotic Initial, and 97763 Orthotic/Prosthetic subsequent  RECOMMENDED OTHER SERVICES: none at this time  CONSULTED AND AGREED WITH PLAN OF CARE: Patient  PLAN FOR NEXT SESSION: review HEP and update as appropriate, continue with education for activity modifications, ?paraffin/ultrasound  (pt may not be able to attend therapy after she returns to work from maternity leave)   [facility], OTR/L 10/30/2024, 10:46 AM   "

## 2024-10-30 ENCOUNTER — Ambulatory Visit: Admitting: Occupational Therapy

## 2024-10-30 ENCOUNTER — Encounter: Payer: Self-pay | Admitting: Occupational Therapy

## 2024-10-30 DIAGNOSIS — M654 Radial styloid tenosynovitis [de Quervain]: Secondary | ICD-10-CM | POA: Insufficient documentation

## 2024-10-30 DIAGNOSIS — M79641 Pain in right hand: Secondary | ICD-10-CM | POA: Diagnosis present

## 2024-10-30 DIAGNOSIS — M6281 Muscle weakness (generalized): Secondary | ICD-10-CM | POA: Diagnosis present

## 2024-10-30 DIAGNOSIS — M79642 Pain in left hand: Secondary | ICD-10-CM | POA: Insufficient documentation

## 2024-10-30 NOTE — Patient Instructions (Addendum)
 Extension (Active With Finger Extension)    With forearm on table and wrist over edge, lift hand with fingers straight. Hold 3 seconds. Repeat 10 times. Do 3 sessions per day.  Copyright  VHI. All rights reserved.  Extension (Passive)    Using other hand, lift hand at wrist as far as possible. Hold 20 seconds.  Repeat 5 times. Do 3 sessions per day.Composite Extension   OR (Passive Flexor Stretch)    Sitting with elbows on table and palms together, slowly lower wrists toward table until stretch is felt. Be sure to keep palms together throughout stretch. Hold 20 seconds. Relax. Repeat 5 times. Do 3 sessions per day.    WRIST: Ulnar Deviation No Gravity    Rest arm and hand on surface, palm down. Move wrist away from body.  Keep thumb still, then move hand back to the middle. 10 reps, 3 times per day  Ulnar Deviation (Passive)    With palm and wrist on table, place other hand on top to steady while bringing elbow outward. Hold 20 seconds. Repeat 5 times. Do  3 sessions per day.     AROM: Thumb Flexion / Extension    Actively bend right thumb across palm as far as possible. Hold 5 seconds. Relax. Then pull thumb back into hitchhike position. Repeat 10 times per set. Do

## 2024-11-09 ENCOUNTER — Ambulatory Visit: Admitting: Occupational Therapy

## 2024-11-11 ENCOUNTER — Ambulatory Visit: Admitting: Occupational Therapy

## 2024-11-12 ENCOUNTER — Ambulatory Visit

## 2024-11-18 ENCOUNTER — Ambulatory Visit: Admitting: Occupational Therapy

## 2024-11-18 ENCOUNTER — Encounter (HOSPITAL_COMMUNITY): Payer: Self-pay | Admitting: Obstetrics & Gynecology

## 2024-11-18 DIAGNOSIS — M79641 Pain in right hand: Secondary | ICD-10-CM

## 2024-11-18 DIAGNOSIS — M6281 Muscle weakness (generalized): Secondary | ICD-10-CM

## 2024-11-18 DIAGNOSIS — M79642 Pain in left hand: Secondary | ICD-10-CM

## 2024-11-18 NOTE — Therapy (Signed)
 " OUTPATIENT OCCUPATIONAL THERAPY ORTHO EVALUATION  Patient Name: Carly Murray MRN: [id number] DOB:[birth year], 39 y.o., female Today's Date: 11/18/2024  PCP: Bernardino Pinal, PA REFERRING PROVIDER: Dr. Redell Robes  END OF SESSION:  OT End of Session - 11/18/24 1012     Visit Number 2    Number of Visits 13    Date for Recertification  12/14/24    Authorization Type [employer]--awaiting [place]    OT Start Time [phone]    OT Stop Time 1012    OT Time Calculation (min) 38 min    Activity Tolerance Patient tolerated treatment well    Behavior During Therapy WFL for tasks assessed/performed           Past Medical History:  Diagnosis Date   Hypertension    Obesity    Recurrent infection of skin    Past Surgical History:  Procedure Laterality Date   CESAREAN SECTION N/A 09/09/2024   Procedure: CESAREAN DELIVERY;  Surgeon: Jayne Vonn DEL, MD;  Location: [facility] LD ORS;  Service: Obstetrics;  Laterality: N/A;   KNEE SURGERY     Patient Active Problem List   Diagnosis Date Noted   Encounter for post surgical wound check 09/14/2024   Difficult intubation 09/12/2024   Status post primary low transverse cesarean section 09/11/2024   Chronic hypertension affecting pregnancy 07/16/2024   BMI 45.0-49.9, adult (HCC) 07/16/2024   Prediabetes 04/23/2024   Moderate dysplasia of cervix 01/12/2015    ONSET DATE: 10/22/24 (referral date)  REFERRING DIAG: M65.4 (ICD-10-CM) - De Quervain's tenosynovitis, left M65.4 (ICD-10-CM) - De Quervain's tenosynovitis, right  THERAPY DIAG:  Pain in left hand  Pain in right hand  Muscle weakness (generalized)  Rationale for Evaluation and Treatment: Rehabilitation  SUBJECTIVE:   SUBJECTIVE STATEMENT: Reports no pain PERTINENT HISTORY:   Per 10/23/23 MD note:  Jenelle GORMAN Dickinson is a 39 year old female with bilateral carpal tunnel syndrome and left de Quervain's tenosynovitis who presents for evaluation of persistent bilateral hand  symptoms.   Bilateral hand paresthesia and discomfort - Persistent paresthesia and discomfort in both hands consistent with carpal tunnel syndrome. - Symptoms have not improved with nightly wrist braces or other conservative treatments. - No change in symptoms compared to last year.   Therapeutic interventions - Received corticosteroid injection in right wrist yesterday. - Receiving corticosteroid injection in left wrist at today's visit.  PRECAUTIONS: None  RED FLAGS: None   WEIGHT BEARING RESTRICTIONS: No  PAIN:  Are you having pain? no FALLS: Has patient fallen in last 6 months? No  LIVING ENVIRONMENT: Lives with: lives with their family and significant other (2 sons--16 y.o. and 29 weeks old) Lives in:  apartment--1 flight to get into  PLOF: Independent  PATIENT GOALS: improve pain, keep it from worsening again  NEXT MD VISIT: 11/13/23  OBJECTIVE:  Note: Objective measures were completed at Evaluation unless otherwise noted.  HAND DOMINANCE: Right  ADLs: Overall ADLs: Pt reports difficulty and pain with gripping/holding items, opening jar, carrying bags (significant other carries car seat), cutting food, holding 28 week old baby.   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  Has night splints that she wears intermittently.  Hx of symptoms prior to pregnancy as well.   EDEMA: none noted today, noted tightness along L radial forearm but pt denies tenderness  COGNITION: Overall cognitive status: Within functional limits for tasks assessed  OBSERVATIONS: Pleasant 39 y.o. female with bilateral  hand/wrist pain during recent pregnancy and after birth (baby currently 39 weeks old).  Pt reports significant pain, but improved significantly since injections last week.     TREATMENT DATE:  11/18/24- Paraffin x 8 mins to bilateral hands and wrists while beginning discussion of activity modifcations. Reviewed previously issued exercises form last visit, min v.c then pt returned demonstration.  10/31/23  evaluation completed; also see education section for education provided.                                                                                                                             PATIENT EDUCATION: Education details: Reveiwed Positions to avoid/basic activity modification for lifting/carrying objects, return to work (as well as basic positioning of baby for feeding/carrying); initial HEP reveiw  Person educated: Patient Education method: Explanation, Demonstration, Verbal cues, and Handouts Education comprehension: verbalized understanding, returned demonstration, verbal cues required, and needs further education  HOME EXERCISE PROGRAM: 10/31/23:   -Positions to avoid/basic activity modification for lifting/carrying objects (as well as basic positioning of baby for feeding/carrying) -Initial HEP -Recommended forearm-based thumb spica splint for aggravating/repetitive activities (particularly for L hand)--pt shown picture of what to look for at store/online  GOALS: Goals reviewed with patient? Yes  SHORT TERM GOALS: Target date: 11/29/24  Pt will be independent with initial HEP for stretching/ROM. Baseline:  needs education, no HEP Goal status: met, 11/18/24  2.  Pt will verbalize understanding of AE/activity modifications for decr pain for ADLs/IADLs (including carrying for baby). Baseline:  needs education Goal status: ongoing, reinforced 11/18/24  3.  Pt will report pain in bilateral hands/wrist consistently less than or equal to 2/10 for ADLs over at least 1  week. Baseline: 0-4/10 pain (significantly improved over last week) Goal status: met, 11/18/24   LONG TERM GOALS: Target date: 12/14/24  Pt will be independent with updated strengthening HEP. Baseline:  needs education, no HEP Goal status: ongoing, 11/18/24  2.  Pt will improve bilateral hand functional use and decr pain for functional tasks as shown by improving score on Quick Dash to 6% or less. Baseline: 11.4% Goal status: ongoing 11/18/24  3.  Pt will demo at least 36lbs L grip strength to assist with lifting/carriyng tasks. Baseline: 26lbs Goal status: ongoing 11/18/24   ASSESSMENT:  CLINICAL IMPRESSION: Patient is progressing towards goals. She demonstrates understanding of inital HEP  PERFORMANCE DEFICITS: in functional skills including ADLs, IADLs, strength, pain, decreased knowledge of precautions, decreased knowledge of use of DME, and UE functional use,  and psychosocial skills including environmental adaptation, habits, and routines and behaviors.   IMPAIRMENTS: are limiting patient from ADLs, IADLs, work, and leisure.  COMORBIDITIES: may have co-morbidities  that affects occupational performance. Patient will benefit from skilled OT to address above impairments and improve overall function.  MODIFICATION OR ASSISTANCE TO COMPLETE EVALUATION: No modification of tasks or assist necessary to complete an evaluation.  OT OCCUPATIONAL PROFILE AND HISTORY: Detailed assessment: Review of records and additional review of physical, cognitive, psychosocial history related to current functional performance.  CLINICAL DECISION MAKING: LOW - limited treatment options, no task modification necessary  REHAB POTENTIAL: Good  EVALUATION COMPLEXITY: Low      PLAN:  OT FREQUENCY: 2x/week  OT DURATION: 6 weeks +eval   PLANNED INTERVENTIONS: 97535 self care/ADL training, 02889 therapeutic exercise, 97530 therapeutic activity, 97112 neuromuscular re-education, 97140 manual therapy,  97035 ultrasound, 97018 paraffin, 02989 moist heat, 97010 cryotherapy, 97034 contrast bath, 97760 Orthotic Initial, and 97763 Orthotic/Prosthetic subsequent  RECOMMENDED OTHER SERVICES: none at this time  CONSULTED AND AGREED WITH PLAN OF CARE: Patient  PLAN FOR NEXT SESSION: Putty?,review HEP and update as appropriate, continue with education for activity modifications, ?paraffin/ultrasound   d/c next visit Dyanna Seiter, OTR/L 11/18/2024, 4:28 PM   "

## 2024-11-20 ENCOUNTER — Ambulatory Visit: Admitting: Occupational Therapy

## 2024-11-23 ENCOUNTER — Ambulatory Visit: Admitting: Occupational Therapy

## 2024-11-25 ENCOUNTER — Ambulatory Visit: Admitting: Occupational Therapy

## 2025-06-30 ENCOUNTER — Ambulatory Visit: Admitting: Physician Assistant
